# Patient Record
Sex: Female | Born: 1975 | Race: Black or African American | Hispanic: No | Marital: Married | Smoking: Never smoker
Health system: Southern US, Community
[De-identification: ages and names within clinical notes are randomized; demographics above are authoritative.]

## PROBLEM LIST (undated history)

## (undated) DIAGNOSIS — I10 Essential (primary) hypertension: Secondary | ICD-10-CM

## (undated) DIAGNOSIS — F32A Depression, unspecified: Secondary | ICD-10-CM

## (undated) DIAGNOSIS — T7840XA Allergy, unspecified, initial encounter: Secondary | ICD-10-CM

## (undated) DIAGNOSIS — M797 Fibromyalgia: Secondary | ICD-10-CM

## (undated) DIAGNOSIS — D649 Anemia, unspecified: Secondary | ICD-10-CM

## (undated) HISTORY — DX: Anemia, unspecified: D64.9

## (undated) HISTORY — DX: Depression, unspecified: F32.A

## (undated) HISTORY — DX: Allergy, unspecified, initial encounter: T78.40XA

## (undated) HISTORY — DX: Fibromyalgia: M79.7

## (undated) HISTORY — PX: OTHER SURGICAL HISTORY: SHX169

---

## 2014-05-05 DIAGNOSIS — O3440 Maternal care for other abnormalities of cervix, unspecified trimester: Secondary | ICD-10-CM | POA: Insufficient documentation

## 2014-05-05 DIAGNOSIS — F32A Depression, unspecified: Secondary | ICD-10-CM | POA: Insufficient documentation

## 2014-05-05 DIAGNOSIS — O09529 Supervision of elderly multigravida, unspecified trimester: Secondary | ICD-10-CM | POA: Insufficient documentation

## 2014-05-05 DIAGNOSIS — O10919 Unspecified pre-existing hypertension complicating pregnancy, unspecified trimester: Secondary | ICD-10-CM | POA: Insufficient documentation

## 2014-05-05 DIAGNOSIS — F329 Major depressive disorder, single episode, unspecified: Secondary | ICD-10-CM | POA: Insufficient documentation

## 2014-05-05 DIAGNOSIS — Z9889 Other specified postprocedural states: Secondary | ICD-10-CM | POA: Insufficient documentation

## 2014-05-05 DIAGNOSIS — F411 Generalized anxiety disorder: Secondary | ICD-10-CM | POA: Insufficient documentation

## 2014-12-24 DIAGNOSIS — IMO0001 Reserved for inherently not codable concepts without codable children: Secondary | ICD-10-CM | POA: Insufficient documentation

## 2014-12-24 DIAGNOSIS — I10 Essential (primary) hypertension: Secondary | ICD-10-CM | POA: Insufficient documentation

## 2014-12-24 DIAGNOSIS — Z975 Presence of (intrauterine) contraceptive device: Secondary | ICD-10-CM | POA: Insufficient documentation

## 2019-02-19 DIAGNOSIS — E559 Vitamin D deficiency, unspecified: Secondary | ICD-10-CM | POA: Insufficient documentation

## 2019-06-10 ENCOUNTER — Ambulatory Visit (INDEPENDENT_AMBULATORY_CARE_PROVIDER_SITE_OTHER): Payer: Self-pay | Admitting: Family Medicine

## 2019-06-10 ENCOUNTER — Encounter: Payer: Self-pay | Admitting: Family Medicine

## 2019-06-10 ENCOUNTER — Telehealth: Payer: Self-pay | Admitting: Family Medicine

## 2019-06-10 DIAGNOSIS — M545 Low back pain, unspecified: Secondary | ICD-10-CM

## 2019-06-10 DIAGNOSIS — M542 Cervicalgia: Secondary | ICD-10-CM

## 2019-06-10 DIAGNOSIS — G8929 Other chronic pain: Secondary | ICD-10-CM

## 2019-06-10 MED ORDER — PREDNISONE 10 MG PO TABS: ORAL_TABLET | ORAL | 0 refills | Status: DC

## 2019-06-10 MED ORDER — MELOXICAM 15 MG PO TABS: 7.5000 mg | ORAL_TABLET | Freq: Every day | ORAL | 6 refills | Status: DC | PRN

## 2019-06-10 MED ORDER — TIZANIDINE HCL 2 MG PO TABS: 2.0000 mg | ORAL_TABLET | Freq: Every evening | ORAL | 1 refills | Status: DC | PRN

## 2019-06-10 NOTE — Telephone Encounter (Signed)
Patient left a message on the voicemail asking for Dr. Junius Roads to call her. She did not say why on the message. Her call back number is [phone]. Thanks
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
## 2019-06-10 NOTE — Telephone Encounter (Signed)
Left message on the patient's voice mail to call back (or I will try calling her again in the morning).

## 2019-06-11 NOTE — Telephone Encounter (Signed)
Record updated
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
## 2019-06-11 NOTE — Telephone Encounter (Signed)
I called and advised the patient. 

## 2019-06-27 ENCOUNTER — Telehealth: Payer: Self-pay | Admitting: Radiology

## 2019-06-27 NOTE — Telephone Encounter (Signed)
I called patient and advised. 

## 2019-06-27 NOTE — Telephone Encounter (Signed)
Patient left voicemail stating she is having adverse reaction to prednisone and requests a call back.  CB  [phone]

## 2019-06-27 NOTE — Telephone Encounter (Signed)
I called patient. She states that she started the prednisone and has had 6,6,5,5, and 4 doses.  With the first dose, she had anxiety in the middle of the night. Last night she was reading something and had purple dots that came up in her vision and she had blurry vision. She also went to the bathroom and had blood in her stool.  Patient would like to know what you would like for her to do?

## 2019-06-27 NOTE — Telephone Encounter (Signed)
Stop the prednisone, wait a day or two for side effects to subside, then can take meloxicam instead.

## 2019-07-25 DIAGNOSIS — T7491XA Unspecified adult maltreatment, confirmed, initial encounter: Secondary | ICD-10-CM | POA: Insufficient documentation

## 2019-07-25 DIAGNOSIS — M545 Low back pain, unspecified: Secondary | ICD-10-CM | POA: Insufficient documentation

## 2019-08-14 ENCOUNTER — Other Ambulatory Visit: Payer: Self-pay

## 2019-08-14 ENCOUNTER — Emergency Department (HOSPITAL_COMMUNITY)
Admission: EM | Admit: 2019-08-14 | Discharge: 2019-08-14 | Discharge status: Against Medical Advice | Payer: Medicaid Other | Attending: Emergency Medicine | Admitting: Emergency Medicine

## 2019-08-14 ENCOUNTER — Encounter (HOSPITAL_COMMUNITY): Payer: Self-pay | Admitting: Emergency Medicine

## 2019-08-14 DIAGNOSIS — Z5321 Procedure and treatment not carried out due to patient leaving prior to being seen by health care provider: Secondary | ICD-10-CM | POA: Insufficient documentation

## 2019-08-14 HISTORY — DX: Essential (primary) hypertension: I10

## 2019-08-14 NOTE — ED Triage Notes (Signed)
Pt reports noting increasing SOB since Sunday. States she thinks its related to the lisinopril she's taking. No perioral or tongue swelling noted. Speaking in full sentences, NAD, VSS.

## 2019-08-14 NOTE — ED Notes (Signed)
Called for vitals no answer  And stickers found at sort desk

## 2020-01-12 ENCOUNTER — Encounter: Payer: Self-pay | Admitting: Family Medicine

## 2020-01-12 ENCOUNTER — Other Ambulatory Visit: Payer: Self-pay

## 2020-01-12 ENCOUNTER — Ambulatory Visit (INDEPENDENT_AMBULATORY_CARE_PROVIDER_SITE_OTHER): Payer: Medicaid Other | Admitting: Family Medicine

## 2020-01-12 DIAGNOSIS — M5441 Lumbago with sciatica, right side: Secondary | ICD-10-CM

## 2020-01-12 DIAGNOSIS — G8929 Other chronic pain: Secondary | ICD-10-CM
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
## 2020-01-21 ENCOUNTER — Other Ambulatory Visit: Payer: Self-pay

## 2020-01-21 ENCOUNTER — Ambulatory Visit (HOSPITAL_COMMUNITY)
Admission: EM | Admit: 2020-01-21 | Discharge: 2020-01-21 | Discharge status: Absent without leave | Payer: Medicaid Other

## 2020-01-30 ENCOUNTER — Telehealth: Payer: Self-pay | Admitting: Family Medicine

## 2020-01-30 NOTE — Telephone Encounter (Signed)
Patient called stating that she has not heard anything in reference to an appointment with Neurology.  I see a note in the referral that the provider that does the particular test will be out until August and they request that we send the patient somewhere else.  Thank you.

## 2020-01-30 NOTE — Telephone Encounter (Signed)
Have you found someone else that can do this since Dr. Posey Pronto is out?

## 2020-02-03 NOTE — Telephone Encounter (Signed)
Order sent to [facility]

## 2020-03-11 ENCOUNTER — Other Ambulatory Visit: Payer: Self-pay

## 2020-03-11 DIAGNOSIS — M545 Low back pain, unspecified: Secondary | ICD-10-CM

## 2020-03-15 ENCOUNTER — Ambulatory Visit (INDEPENDENT_AMBULATORY_CARE_PROVIDER_SITE_OTHER): Payer: Self-pay | Admitting: Neurology

## 2020-03-15 ENCOUNTER — Encounter: Payer: Self-pay | Admitting: Neurology

## 2020-03-15 ENCOUNTER — Ambulatory Visit: Payer: Self-pay | Admitting: Neurology

## 2020-03-15 DIAGNOSIS — G8929 Other chronic pain: Secondary | ICD-10-CM

## 2020-03-15 DIAGNOSIS — M545 Low back pain, unspecified: Secondary | ICD-10-CM

## 2020-03-15 DIAGNOSIS — M5441 Lumbago with sciatica, right side: Secondary | ICD-10-CM

## 2020-03-15 NOTE — Progress Notes (Signed)
[place]    Nerve / Sites Muscle Latency Ref. Amplitude Ref. Rel Amp Segments Distance Velocity Ref. Area    ms ms mV mV %  cm m/s m/s mVms  R Peroneal - EDB     Ankle EDB 4.0 ?6.5 8.4 ?2.0 100 Ankle - EDB 9   19.4     Fib head EDB 10.7  6.9  82.1 Fib head - Ankle 32 48 ?44 17.7     Pop fossa EDB 12.8  6.5  94.6 Pop fossa - Fib head 10 48 ?44 17.3         Pop fossa - Ankle      R Tibial - AH     Ankle AH 3.5 ?5.8 8.5 ?4.0 100 Ankle - AH 9   17.5     Pop fossa AH 12.6  7.0  81.6 Pop fossa - Ankle 41 45 ?41 18.5         SNC    Nerve / Sites Rec. Site Peak Lat Ref.  Amp Ref. Segments Distance    ms ms V V  cm  R Sural - Ankle (Calf)     Calf Ankle 3.6 ?4.4 17 ?6 Calf - Ankle 14  R Superficial peroneal - Ankle     Lat leg Ankle 4.1 ?4.4 7 ?6 Lat leg - Ankle 14         F  Wave    Nerve F Lat Ref.   ms ms  R Tibial - AH 52.4 ?56.0
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
## 2020-03-15 NOTE — Progress Notes (Signed)
Please refer to EMG and nerve conduction procedure note.  

## 2020-04-02 ENCOUNTER — Ambulatory Visit (INDEPENDENT_AMBULATORY_CARE_PROVIDER_SITE_OTHER): Payer: Medicaid Other | Admitting: Obstetrics and Gynecology

## 2020-04-02 ENCOUNTER — Other Ambulatory Visit: Payer: Self-pay

## 2020-04-02 ENCOUNTER — Encounter: Payer: Self-pay | Admitting: Obstetrics and Gynecology

## 2020-04-02 ENCOUNTER — Other Ambulatory Visit (HOSPITAL_COMMUNITY)
Admission: RE | Admit: 2020-04-02 | Discharge: 2020-04-02 | Disposition: A | Discharge status: Home / Self Care | Payer: 59 | Source: Ambulatory Visit | Attending: Obstetrics and Gynecology | Admitting: Obstetrics and Gynecology

## 2020-04-02 VITALS — BP 163/118 | HR 76 | Ht 67.5 in | Wt 191.4 lb

## 2020-04-02 DIAGNOSIS — Z91410 Personal history of adult physical and sexual abuse: Secondary | ICD-10-CM

## 2020-04-02 DIAGNOSIS — Z30432 Encounter for removal of intrauterine contraceptive device: Secondary | ICD-10-CM

## 2020-04-02 DIAGNOSIS — Z124 Encounter for screening for malignant neoplasm of cervix: Secondary | ICD-10-CM | POA: Diagnosis present

## 2020-04-02 DIAGNOSIS — Z01419 Encounter for gynecological examination (general) (routine) without abnormal findings: Secondary | ICD-10-CM | POA: Diagnosis not present

## 2020-04-02 DIAGNOSIS — N898 Other specified noninflammatory disorders of vagina: Secondary | ICD-10-CM | POA: Diagnosis not present

## 2020-04-02 DIAGNOSIS — N393 Stress incontinence (female) (male): Secondary | ICD-10-CM

## 2020-04-02 DIAGNOSIS — T7491XD Unspecified adult maltreatment, confirmed, subsequent encounter: Secondary | ICD-10-CM

## 2020-04-02 DIAGNOSIS — Z113 Encounter for screening for infections with a predominantly sexual mode of transmission: Secondary | ICD-10-CM

## 2020-04-02 DIAGNOSIS — Z1231 Encounter for screening mammogram for malignant neoplasm of breast: Secondary | ICD-10-CM
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
## 2020-04-02 NOTE — Progress Notes (Signed)
NGYN presents for IUD removal and c/o vaginal odor and urinary incontinence  Mirena inserted 12/15/14  Elevated BP - did not take BP meds

## 2020-04-02 NOTE — Patient Instructions (Signed)
Vulvar Hygiene You should wear cotton brief underwear and avoid tight-fitting undergarments including avoiding underwear made of synthetic fabrics. You should keep area as dry as possible. Use non-irritating soaps and laundry detergents. Use as few products in vulvar area as possible as even products designed to improve irritation can cause more irritation. Do not use douches, lotions or vaginal sprays as these are very irritating.  

## 2020-04-05 ENCOUNTER — Other Ambulatory Visit: Payer: Self-pay

## 2020-04-05 ENCOUNTER — Other Ambulatory Visit: Payer: Medicaid Other

## 2020-04-06 ENCOUNTER — Telehealth: Payer: Self-pay | Admitting: Family Medicine

## 2020-04-06 LAB — HEPATITIS B SURFACE ANTIGEN: Hepatitis B Surface Ag: NEGATIVE

## 2020-04-06 LAB — CERVICOVAGINAL ANCILLARY ONLY
Chlamydia: NEGATIVE
Comment: NEGATIVE
Comment: NORMAL
Neisseria Gonorrhea: NEGATIVE

## 2020-04-06 LAB — HEPATITIS C ANTIBODY: Hep C Virus Ab: <0.1 s/co ratio (ref 0.0–0.9)

## 2020-04-06 LAB — HIV ANTIBODY (ROUTINE TESTING W REFLEX): HIV Screen 4th Generation wRfx: NONREACTIVE

## 2020-04-06 LAB — RPR: RPR Ser Ql: NONREACTIVE

## 2020-04-06 NOTE — Telephone Encounter (Signed)
It was Dr. Jannifer Franklin at [facility] that did the test - notes in the chart.  Please advise.

## 2020-04-06 NOTE — Telephone Encounter (Signed)
Patient called.   She was sent to Dr.Newton for a NCS and he found nothing. She wants to know what her next step is now.   Call back: [phone]

## 2020-04-06 NOTE — Telephone Encounter (Signed)
Can we get her MRI images sent to [place] on CD?  I'd like to see them myself to be sure nothing was missed.

## 2020-04-07 LAB — URINE CULTURE

## 2020-04-07 LAB — CYTOLOGY - PAP
Comment: NEGATIVE
Diagnosis: NEGATIVE
High risk HPV: NEGATIVE

## 2020-04-07 NOTE — Telephone Encounter (Signed)
Left VM to call me back regarding MRI CD.

## 2020-04-08 ENCOUNTER — Other Ambulatory Visit: Payer: Self-pay | Admitting: Obstetrics and Gynecology

## 2020-04-08 DIAGNOSIS — Z1231 Encounter for screening mammogram for malignant neoplasm of breast: Secondary | ICD-10-CM

## 2020-04-09 ENCOUNTER — Ambulatory Visit: Payer: 59 | Admitting: Nurse Practitioner

## 2020-04-09 ENCOUNTER — Other Ambulatory Visit: Payer: Self-pay | Admitting: Nurse Practitioner

## 2020-04-09 ENCOUNTER — Other Ambulatory Visit: Payer: Self-pay

## 2020-04-09 ENCOUNTER — Encounter: Payer: Self-pay | Admitting: Nurse Practitioner

## 2020-04-09 VITALS — Ht 67.0 in | Wt 194.0 lb

## 2020-04-09 DIAGNOSIS — Z7689 Persons encountering health services in other specified circumstances: Secondary | ICD-10-CM

## 2020-04-09 DIAGNOSIS — D649 Anemia, unspecified: Secondary | ICD-10-CM | POA: Diagnosis not present

## 2020-04-09 DIAGNOSIS — E669 Obesity, unspecified: Secondary | ICD-10-CM | POA: Diagnosis not present

## 2020-04-09 DIAGNOSIS — I10 Essential (primary) hypertension: Secondary | ICD-10-CM

## 2020-04-09 DIAGNOSIS — E559 Vitamin D deficiency, unspecified: Secondary | ICD-10-CM

## 2020-04-09 MED ORDER — LOSARTAN POTASSIUM 50 MG PO TABS: 50.0000 mg | ORAL_TABLET | Freq: Every day | ORAL | 0 refills | Status: DC

## 2020-04-09 MED ORDER — FERROUS SULFATE 325 (65 FE) MG PO TABS: 325.0000 mg | ORAL_TABLET | Freq: Every day | ORAL | 1 refills | Status: DC

## 2020-04-09 NOTE — Addendum Note (Signed)
Addended bySteffanie Dunn on: 04/09/2020 09:25 AM   Modules accepted: Orders

## 2020-04-09 NOTE — Progress Notes (Signed)
Virtual Visit via Telephone Note Due to national recommendations of social distancing due to [place] 19, telehealth visit is felt to be most appropriate for this patient at this time.  I discussed the limitations, risks, security and privacy concerns of performing an evaluation and management service by telephone and the availability of in person appointments. I also discussed with the patient that there may be a patient responsible charge related to this service. The patient expressed understanding and agreed to proceed.    I connected with Catherine Garza on 04/09/20  at   8:50 AM EDT  EDT by telephone and verified that I am speaking with the correct person using two identifiers.   Consent I discussed the limitations, risks, security and privacy concerns of performing an evaluation and management service by telephone and the availability of in person appointments. I also discussed with the patient that there may be a patient responsible charge related to this service. The patient expressed understanding and agreed to proceed.   Location of Patient: Private  Residence   Location of Provider: [place] and [place] participating in Telemedicine visit: Catherine Rankins FNP-BC [place]    History of Present Illness: Telemedicine visit for: Establish Care Patient has been counseled on age-appropriate routine health concerns for screening and prevention. These are reviewed and up-to-date. Referrals have been placed accordingly. Immunizations are up-to-date or declined.    PAP SMEAR 03-2020 MAMMOGRAM scheduled for 7-22   Essential Hypertension Not well controlled. She does not frequently monitor her blood pressure at home. Currently taking amlodipine 10 mg daily. She has not been taking her amlodipine as she states it  She has also taken lisinopril in the past but can not recall the reason she stopped taking it. Will start her on Losartan today. Denies  chest pain, shortness of breath, palpitations, lightheadedness, dizziness, headaches or BLE edema.  BP Readings from Last 3 Encounters:  04/02/20 (!) 163/118  08/14/19 (!) 158/99    Left arm is painful when she stretches it out. Aggravating factors: heavy lifting. She has not taken any OTC analgesics nor does she perform any ROM exercises. I recommended she perform stretching and ROM exercises as well as heat application. She denies any trauma or injury.     Past Medical History:  Diagnosis Date  . Depression   . Hypertension     Past Surgical History:  Procedure Laterality Date  . leep      Family History  Problem Relation Age of Onset  . Hypertension Mother   . Hypertension Father   . Ovarian cancer Maternal Grandmother   . Throat cancer Paternal Grandfather        smoker     Social History   Socioeconomic History  . Marital status: Single    Spouse name: Not on file  . Number of children: Not on file  . Years of education: Not on file  . Highest education level: Not on file  Occupational History  . Not on file  Tobacco Use  . Smoking status: Never Smoker  . Smokeless tobacco: Never Used  Substance and Sexual Activity  . Alcohol use: Never  . Drug use: Never  . Sexual activity: Not Currently    Birth control/protection: None  Other Topics Concern  . Not on file  Social History Narrative  . Not on file   Social Determinants of Health   Financial Resource Strain:   . Difficulty of Paying Living Expenses:  Food Insecurity:   . Worried About Programme researcher, broadcasting/film/video in the Last Year:   . Barista in the Last Year:   Transportation Needs:   . Freight forwarder (Medical):   Marland Kitchen Lack of Transportation (Non-Medical):   Physical Activity:   . Days of Exercise per Week:   . Minutes of Exercise per Session:   Stress:   . Feeling of Stress :   Social Connections:   . Frequency of Communication with Friends and Family:   . Frequency of Social Gatherings  with Friends and Family:   . Attends Religious Services:   . Active Member of Clubs or Organizations:   . Attends Banker Meetings:   Marland Kitchen Marital Status:      Observations/Objective: Awake, alert and oriented x 3   Review of Systems  Constitutional: Negative for fever, malaise/fatigue and weight loss.  HENT: Negative.  Negative for nosebleeds.   Eyes: Negative.  Negative for blurred vision, double vision and photophobia.  Respiratory: Negative.  Negative for cough and shortness of breath.   Cardiovascular: Negative.  Negative for chest pain, palpitations and leg swelling.  Gastrointestinal: Negative.  Negative for heartburn, nausea and vomiting.  Musculoskeletal: Negative.  Negative for myalgias.  Neurological: Negative.  Negative for dizziness, focal weakness, seizures and headaches.  Psychiatric/Behavioral: Negative.  Negative for suicidal ideas.    Assessment and Plan: [name] was seen today for establish care.  Diagnoses and all orders for this visit:  Encounter to establish care  Essential hypertension -     CMP14+EGFR; Future -     TSH; Future -     losartan (COZAAR) 50 MG tablet; Take 1 tablet (50 mg total) by mouth daily. Continue all antihypertensives as prescribed.  Remember to bring in your blood pressure log with you for your follow up appointment.  DASH/Mediterranean Diets are healthier choices for HTN.    Obesity (BMI 30-39.9) -     CMP14+EGFR; Future -     Hemoglobin A1c; Future -     Lipid panel; Future -     TSH; Future Discussed diet and exercise for person with BMI >30. Instructed: You must burn more calories than you eat. Losing 5 percent of your body weight should be considered a success. In the longer term, losing more than 15 percent of your body weight and staying at this weight is an extremely good result. However, keep in mind that even losing 5 percent of your body weight leads to important health benefits, so try not to get discouraged  if you're not able to lose more than this. Will recheck weight in 3-6 months.  Anemia, unspecified type -     CBC; Future -     ferrous sulfate 325 (65 FE) MG tablet; Take 1 tablet (325 mg total) by mouth daily with breakfast.  Vitamin D deficiency disease -     VITAMIN D 25 Hydroxy (Vit-D Deficiency, Fractures); Future     Follow Up Instructions Return in about 4 weeks (around 05/07/2020) for BP recheck.     I discussed the assessment and treatment plan with the patient. The patient was provided an opportunity to ask questions and all were answered. The patient agreed with the plan and demonstrated an understanding of the instructions.   The patient was advised to call back or seek an in-person evaluation if the symptoms worsen or if the condition fails to improve as anticipated.  I provided 18 minutes of non-face-to-face time during  this encounter including median intraservice time, reviewing previous notes, labs, imaging, medications and explaining diagnosis and management.  Gildardo Pounds, FNP-BC

## 2020-04-10 LAB — CBC
Hematocrit: 42.5 % (ref 34.0–46.6)
Hemoglobin: 14.3 g/dL (ref 11.1–15.9)
MCH: 28.8 pg (ref 26.6–33.0)
MCHC: 33.6 g/dL (ref 31.5–35.7)
MCV: 86 fL (ref 79–97)
Platelets: 293 10*3/uL (ref 150–450)
RBC: 4.96 x10E6/uL (ref 3.77–5.28)
RDW: 13 % (ref 11.7–15.4)
WBC: 8.9 10*3/uL (ref 3.4–10.8)

## 2020-04-10 LAB — CMP14+EGFR
ALT: 37 [IU]/L — ABNORMAL HIGH (ref 0–32)
AST: 20 [IU]/L (ref 0–40)
Albumin/Globulin Ratio: 1.8 (ref 1.2–2.2)
Albumin: 4.6 g/dL (ref 3.8–4.8)
Alkaline Phosphatase: 111 [IU]/L (ref 48–121)
BUN/Creatinine Ratio: 12 (ref 9–23)
BUN: 9 mg/dL (ref 6–24)
Bilirubin Total: 0.3 mg/dL (ref 0.0–1.2)
CO2: 22 mmol/L (ref 20–29)
Calcium: 9 mg/dL (ref 8.7–10.2)
Chloride: 106 mmol/L (ref 96–106)
Creatinine, Ser: 0.75 mg/dL (ref 0.57–1.00)
GFR calc Af Amer: 114 mL/min/{1.73_m2}
GFR calc non Af Amer: 99 mL/min/{1.73_m2}
Globulin, Total: 2.5 g/dL (ref 1.5–4.5)
Glucose: 94 mg/dL (ref 65–99)
Potassium: 4 mmol/L (ref 3.5–5.2)
Sodium: 139 mmol/L (ref 134–144)
Total Protein: 7.1 g/dL (ref 6.0–8.5)

## 2020-04-10 LAB — LIPID PANEL
Chol/HDL Ratio: 3.9 ratio (ref 0.0–4.4)
Cholesterol, Total: 216 mg/dL — ABNORMAL HIGH (ref 100–199)
HDL: 55 mg/dL (ref >39)
LDL Chol Calc (NIH): 153 mg/dL — ABNORMAL HIGH (ref 0–99)
Triglycerides: 49 mg/dL (ref 0–149)
VLDL Cholesterol Cal: 8 mg/dL (ref 5–40)

## 2020-04-10 LAB — TSH: TSH: 1.99 u[IU]/mL (ref 0.450–4.500)

## 2020-04-10 LAB — HEMOGLOBIN A1C
Est. average glucose Bld gHb Est-mCnc: 103 mg/dL
Hgb A1c MFr Bld: 5.2 % (ref 4.8–5.6)

## 2020-04-10 LAB — VITAMIN D 25 HYDROXY (VIT D DEFICIENCY, FRACTURES): Vit D, 25-Hydroxy: 30.5 ng/mL (ref 30.0–100.0)

## 2020-04-12 ENCOUNTER — Other Ambulatory Visit: Payer: Medicaid Other

## 2020-04-12 NOTE — Telephone Encounter (Signed)
The patient will drop off the cd tomorrow morning. She just wants to make sure she gets the cd back. I advised her she will be contacted once he has looked at the cd and has decided on the next step, and the cd will be returned to her.

## 2020-04-13 ENCOUNTER — Encounter: Payer: Self-pay | Admitting: *Deleted

## 2020-04-14 ENCOUNTER — Telehealth: Payer: Self-pay | Admitting: Family Medicine

## 2020-04-14 NOTE — Telephone Encounter (Signed)
Dr. Junius Roads now has the cd and will review it. Will call the patient with any instructions/plan from Dr. Junius Roads (and let her know when she can pick up the cd).

## 2020-04-14 NOTE — Progress Notes (Signed)
Attempt to reach patient for her e-mail address. No answer and LVM.

## 2020-04-14 NOTE — Telephone Encounter (Signed)
I reviewed the MRI of cervical and lumbar spine on CD and agree that there are a couple bulging discs, but no nerve impingement.  Treatment options would include:  Trying physical therapy.  Referral for epidural steroid injection.  Repeating MRI scan to see if anything has changed.

## 2020-04-14 NOTE — Telephone Encounter (Signed)
Do you know where that CD is now?

## 2020-04-14 NOTE — Telephone Encounter (Signed)
Wanted to let Dr.Hilts know she dropped off the MRI CD.

## 2020-04-15 ENCOUNTER — Encounter: Payer: Self-pay | Admitting: Family Medicine

## 2020-04-15 ENCOUNTER — Ambulatory Visit: Payer: 59

## 2020-04-15 ENCOUNTER — Other Ambulatory Visit: Payer: Self-pay

## 2020-04-15 ENCOUNTER — Ambulatory Visit
Admission: RE | Admit: 2020-04-15 | Discharge: 2020-04-15 | Disposition: A | Discharge status: Home / Self Care | Payer: 59 | Source: Ambulatory Visit | Attending: Obstetrics and Gynecology | Admitting: Obstetrics and Gynecology

## 2020-04-15 DIAGNOSIS — Z1231 Encounter for screening mammogram for malignant neoplasm of breast: Secondary | ICD-10-CM

## 2020-04-16 ENCOUNTER — Other Ambulatory Visit: Payer: Self-pay

## 2020-04-16 DIAGNOSIS — G8929 Other chronic pain: Secondary | ICD-10-CM

## 2020-04-19 ENCOUNTER — Other Ambulatory Visit: Payer: Self-pay | Admitting: Obstetrics and Gynecology

## 2020-04-19 DIAGNOSIS — R928 Other abnormal and inconclusive findings on diagnostic imaging of breast: Secondary | ICD-10-CM

## 2020-04-22 ENCOUNTER — Telehealth: Payer: Self-pay | Admitting: Physical Medicine and Rehabilitation

## 2020-04-22 NOTE — Telephone Encounter (Signed)
Patient called. Says she called her insurance company. They need more information in order for injection to be approved for Dr. Ernestina Patches. [employer]'s fax number is [phone]. What needs to be on the cover sheet is  case #[id number] attention reconsideration DOB [birth year] Catherine Garza. Patient also request a call back from Dr. Junius Roads. CB [phone]

## 2020-04-22 NOTE — Telephone Encounter (Signed)
Not sure what else they need for this auth.

## 2020-04-26 NOTE — Telephone Encounter (Signed)
I sent the patient a message through MyChart - Dr. Romona Curls assistant is still working on this for her and she will be in contact with the patient.

## 2020-04-30 ENCOUNTER — Telehealth: Payer: Self-pay | Admitting: Family Medicine

## 2020-04-30 NOTE — Telephone Encounter (Signed)
Pt called stating she's going to need Dr.Hilts to call her insurance company and explain why he is referring her to [facility];  and pt would like a CB to further discuss the pain radiating and the giving out of her legs.   Insurance # [id number] * Option 4  [phone]

## 2020-05-03 NOTE — Telephone Encounter (Signed)
FYI:  I called the patient. She says the insurance company has sent her a letter - her understanding of it is that [employer] does not have enough information and so they have not authorized the [facility]. I advised the patient that all pertinent information has been sent to the insurance company and their response is still pending. The patient said she has asked the facility where she has been to PT to also send records or a note about her PT visits (proving conservative care has been tried). The patient is unsure if this has happened and plans to follow up on this, herself. I advised her that the note should include how many PT visits she has been to and for how long. I am not sure what else is needed for the insurance company to authorize the [facility].

## 2020-05-04 ENCOUNTER — Telehealth: Payer: Self-pay | Admitting: Family Medicine

## 2020-05-04 NOTE — Telephone Encounter (Signed)
I have seen Catherine Garza on June 10, 2019 and also January 12, 2020.  At both visits she complained of chronic back pain with radiation into the right leg.  This is well-documented in both office notes.  Since she has otherwise failed conservative management, I believe it is medically indicated for her to undergo epidural steroid injection in the lumbar spine.  A letter from her insurance company dated April 30, 2020 states that this request has been denied due to lack of documentation of her radicular pain.  Clearly this is incorrect as her radicular symptoms have been documented twice in my notes.  Please reconsider your denial of our request for epidural steroid injection.

## 2020-05-05 ENCOUNTER — Ambulatory Visit
Admission: RE | Admit: 2020-05-05 | Discharge: 2020-05-05 | Disposition: A | Discharge status: Home / Self Care | Payer: 59 | Source: Ambulatory Visit | Attending: Obstetrics and Gynecology | Admitting: Obstetrics and Gynecology

## 2020-05-05 ENCOUNTER — Ambulatory Visit: Payer: 59

## 2020-05-05 ENCOUNTER — Other Ambulatory Visit: Payer: Self-pay

## 2020-05-05 DIAGNOSIS — R928 Other abnormal and inconclusive findings on diagnostic imaging of breast: Secondary | ICD-10-CM

## 2020-05-12 ENCOUNTER — Other Ambulatory Visit: Payer: Self-pay

## 2020-05-12 ENCOUNTER — Ambulatory Visit: Payer: 59 | Attending: Nurse Practitioner | Admitting: Nurse Practitioner

## 2020-05-12 ENCOUNTER — Encounter: Payer: Self-pay | Admitting: Nurse Practitioner

## 2020-05-12 VITALS — BP 137/90 | HR 79 | Temp 97.7°F | Ht 67.5 in | Wt 198.0 lb

## 2020-05-12 DIAGNOSIS — I10 Essential (primary) hypertension: Secondary | ICD-10-CM

## 2020-05-12 DIAGNOSIS — D500 Iron deficiency anemia secondary to blood loss (chronic): Secondary | ICD-10-CM
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
## 2020-05-14 ENCOUNTER — Encounter: Payer: Self-pay | Admitting: Nurse Practitioner

## 2020-05-21 ENCOUNTER — Telehealth: Payer: Self-pay | Admitting: Physical Medicine and Rehabilitation

## 2020-05-21 NOTE — Telephone Encounter (Signed)
Left message

## 2020-05-21 NOTE — Telephone Encounter (Signed)
Patient called requesting a call back. Patient has medical questions for Dr. Ernestina Patches. Dr. Ernestina Patches please call patient at [phone].

## 2020-05-28 ENCOUNTER — Telehealth: Payer: Self-pay | Admitting: Physical Medicine and Rehabilitation

## 2020-05-28 NOTE — Telephone Encounter (Signed)
I have sch her for 06/03/20.

## 2020-05-28 NOTE — Telephone Encounter (Signed)
Patient returning call to [facility]. Please call patient at [phone].

## 2020-06-03 ENCOUNTER — Ambulatory Visit (INDEPENDENT_AMBULATORY_CARE_PROVIDER_SITE_OTHER): Payer: 59 | Admitting: Physical Medicine and Rehabilitation

## 2020-06-03 ENCOUNTER — Other Ambulatory Visit: Payer: Self-pay

## 2020-06-03 DIAGNOSIS — M5441 Lumbago with sciatica, right side: Secondary | ICD-10-CM | POA: Diagnosis not present

## 2020-06-03 DIAGNOSIS — G8929 Other chronic pain: Secondary | ICD-10-CM

## 2020-06-03 DIAGNOSIS — M5116 Intervertebral disc disorders with radiculopathy, lumbar region: Secondary | ICD-10-CM | POA: Diagnosis not present

## 2020-06-03 DIAGNOSIS — M47816 Spondylosis without myelopathy or radiculopathy, lumbar region: Secondary | ICD-10-CM

## 2020-06-03 NOTE — Progress Notes (Signed)
   Numeric Pain Rating Scale and Functional Assessment Average Pain 10 Pain Right Now 5 My pain is sharp, stabbing and tingling Pain is worse with: walking, sitting, standing and some activites Pain improves with: rest, heat/ice, therapy/exercise and medication   In the last MONTH (on 0-10 scale) has pain interfered with the following?  1. General activity like being  able to carry out your everyday physical activities such as walking, climbing stairs, carrying groceries, or moving a chair?  Rating(8)  2. Relation with others like being able to carry out your usual social activities and roles such as  activities at home, at work and in your community. Rating(8)  3. Enjoyment of life such that you have  been bothered by emotional problems such as feeling anxious, depressed or irritable?  Rating(8)

## 2020-06-09 ENCOUNTER — Encounter: Payer: Self-pay | Admitting: Physical Medicine and Rehabilitation
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
## 2020-06-14 ENCOUNTER — Telehealth: Payer: Self-pay | Admitting: Physical Medicine and Rehabilitation

## 2020-06-14 ENCOUNTER — Encounter: Payer: Self-pay | Admitting: Physical Medicine and Rehabilitation

## 2020-06-14 NOTE — Telephone Encounter (Signed)
Called pt and sch. 

## 2020-06-14 NOTE — Telephone Encounter (Signed)
ESI has been approved by insurance. Left message #1 to schedule patient for right L5-S1 IL. We can schedule this o Thursday afternoon as a planned 15 minute injection.

## 2020-07-05 ENCOUNTER — Ambulatory Visit: Payer: Self-pay

## 2020-07-05 ENCOUNTER — Ambulatory Visit (INDEPENDENT_AMBULATORY_CARE_PROVIDER_SITE_OTHER): Payer: 59 | Admitting: Physical Medicine and Rehabilitation

## 2020-07-05 ENCOUNTER — Encounter: Payer: Self-pay | Admitting: Physical Medicine and Rehabilitation

## 2020-07-05 ENCOUNTER — Other Ambulatory Visit: Payer: Self-pay

## 2020-07-05 VITALS — BP 131/93 | HR 86

## 2020-07-05 DIAGNOSIS — M5116 Intervertebral disc disorders with radiculopathy, lumbar region: Secondary | ICD-10-CM | POA: Diagnosis not present

## 2020-07-05 MED ORDER — METHYLPREDNISOLONE ACETATE 80 MG/ML IJ SUSP
80.0000 mg | Freq: Once | INTRAMUSCULAR | Status: AC
  Administered 2020-07-05: 80 mg

## 2020-07-05 NOTE — Procedures (Signed)
Lumbar Epidural Steroid Injection - Interlaminar Approach with Fluoroscopic Guidance  Patient: Catherine Garza      Date of Birth: April 17, 1976 MRN: [id number] PCP: Gildardo Pounds, NP      Visit Date: 07/05/2020   Universal Protocol:     Consent Given By: the patient  Position: PRONE  Additional Comments: Vital signs were monitored before and after the procedure. Patient was prepped and draped in the usual sterile fashion. The correct patient, procedure, and site was verified.   Injection Procedure Details:   Procedure diagnoses:  1. Radiculopathy due to lumbar intervertebral disc disorder      Meds Administered:  Meds ordered this encounter  Medications  . methylPREDNISolone acetate (DEPO-MEDROL) injection 80 mg     Laterality: Right  Location/Site:  L5-S1  Needle size: 20 G  Needle type: Tuohy  Needle Placement: Paramedian epidural  Findings:   -Comments: Excellent flow of contrast into the epidural space.  Procedure Details: Using a paramedian approach from the side mentioned above, the region overlying the inferior lamina was localized under fluoroscopic visualization and the soft tissues overlying this structure were infiltrated with 4 ml. of 1% Lidocaine without Epinephrine. The Tuohy needle was inserted into the epidural space using a paramedian approach.   The epidural space was localized using loss of resistance along with counter oblique bi-planar fluoroscopic views.  After negative aspirate for air, blood, and CSF, a 2 ml. volume of Isovue-250 was injected into the epidural space and the flow of contrast was observed. Radiographs were obtained for documentation purposes.    The injectate was administered into the level noted above.   Additional Comments:  The patient tolerated the procedure well Dressing: 2 x 2 sterile gauze and Band-Aid    Post-procedure details: Patient was observed during the procedure. Post-procedure instructions were  reviewed.  Patient left the clinic in stable condition.

## 2020-07-05 NOTE — Progress Notes (Signed)
Pt state lower back pain that at some point her leg feels they give out. Pt state walking and sitting for a long time makes the pain worse. Pt state any kind of lifting makes her pain worse. Pt state message and pain meds that didn't work but it can ease the pain.  Numeric Pain Rating Scale and Functional Assessment Average Pain 6   In the last MONTH (on 0-10 scale) has pain interfered with the following?  1. General activity like being  able to carry out your everyday physical activities such as walking, climbing stairs, carrying groceries, or moving a chair?  Rating(10)   +Driver, -BT, -Dye Allergies.

## 2020-07-05 NOTE — Progress Notes (Signed)
Catherine Garza - 44 y.o. female MRN [id number]  Date of birth: [birth year]  Office Visit Note: Visit Date: 07/05/2020 PCP: Gildardo Pounds, NP Referred by: Gildardo Pounds, NP  Subjective: Chief Complaint  Patient presents with  . Lower Back - Pain   HPI:  Catherine Garza is a 44 y.o. female who comes in today at the request of Dr. Laurence Spates for planned Right L5-S1 Lumbar epidural steroid injection with fluoroscopic guidance.  The patient has failed conservative care including home exercise, medications, time and activity modification.  This injection will be diagnostic and hopefully therapeutic.  Please see requesting physician notes for further details and justification.   ROS Otherwise per HPI.  Assessment & Plan: Visit Diagnoses:  1. Radiculopathy due to lumbar intervertebral disc disorder     Plan: No additional findings.   Meds & Orders:  Meds ordered this encounter  Medications  . methylPREDNISolone acetate (DEPO-MEDROL) injection 80 mg    Orders Placed This Encounter  Procedures  . XR C-ARM NO REPORT  . Epidural Steroid injection    Follow-up: Return for visit to requesting physician as needed.   Procedures: No procedures performed  Lumbar Epidural Steroid Injection - Interlaminar Approach with Fluoroscopic Guidance  Patient: Catherine Garza      Date of Birth: 1976/03/14 MRN: [id number] PCP: Gildardo Pounds, NP      Visit Date: 07/05/2020   Universal Protocol:     Consent Given By: the patient  Position: PRONE  Additional Comments: Vital signs were monitored before and after the procedure. Patient was prepped and draped in the usual sterile fashion. The correct patient, procedure, and site was verified.   Injection Procedure Details:   Procedure diagnoses:  1. Radiculopathy due to lumbar intervertebral disc disorder      Meds Administered:  Meds ordered this encounter  Medications  . methylPREDNISolone acetate (DEPO-MEDROL) injection 80 mg      Laterality: Right  Location/Site:  L5-S1  Needle size: 20 G  Needle type: Tuohy  Needle Placement: Paramedian epidural  Findings:   -Comments: Excellent flow of contrast into the epidural space.  Procedure Details: Using a paramedian approach from the side mentioned above, the region overlying the inferior lamina was localized under fluoroscopic visualization and the soft tissues overlying this structure were infiltrated with 4 ml. of 1% Lidocaine without Epinephrine. The Tuohy needle was inserted into the epidural space using a paramedian approach.   The epidural space was localized using loss of resistance along with counter oblique bi-planar fluoroscopic views.  After negative aspirate for air, blood, and CSF, a 2 ml. volume of Isovue-250 was injected into the epidural space and the flow of contrast was observed. Radiographs were obtained for documentation purposes.    The injectate was administered into the level noted above.   Additional Comments:  The patient tolerated the procedure well Dressing: 2 x 2 sterile gauze and Band-Aid    Post-procedure details: Patient was observed during the procedure. Post-procedure instructions were reviewed.  Patient left the clinic in stable condition.     Clinical History: Electrodiagnostic study of the lower limbs:  IMPRESSION:    Nerve conduction studies done on the right lower extremity were unremarkable, no evidence of neuropathy was seen. EMG evaluation of the right lower extremity was within normal limits, there is no evidence of an overlying lumbar radiculopathy on this evaluation.    Jill Alexanders MD  03/15/2020 3:50 PM --- Patient name: Catherine Garza Referring physician: Osker Mason  Radiologist: Deedra Ehrich, MD Date of service: 04/10/2019 Exam requested: MRI SPINE LUMBAR  SPINE W/O CONTRAST  Findings:  T12-L1: No disc herniation, central canal, or foraminal stenosis. L1-2: No disc herniation,  central canal, or foraminal stenosis. L2-3: No disc herniation, central canal, or foraminal stenosis. L3-4: No disc herniation, central canal, or foraminal stenosis. L4-5: No disc herniation, central canal, or foraminal stenosis. L5-S1: There is a central protrusion-type disc herniation impinging the ventral thecal sac and contacting the traversing S1 nerve roots (axial 23, sagittal 6 ).  No significant disc height loss is present.  On the STIR images there is an annular tear posteriorly (sagittal STIR image 6).  No significant foraminal stenosis.  No osteophyte formation or degenerative changes.  IMPRESSION: 1. L5-S1 protrusion-type disc herniation impinging the ventral thecal sac with associated annular tear and abutting the traversing S1 nerve roots  2.  Within a reasonable degree of certainty, the disc herniation appears t traumatic in etiology and associated with the date of injury 02/03/2019 with clinical correlation is recommended.     Objective:  VS:  HT:    WT:   BMI:     BP:(!) 131/93  HR:86bpm  TEMP: ( )  RESP:  Physical Exam Constitutional:      General: She is not in acute distress.    Appearance: Normal appearance. She is not ill-appearing.  HENT:     Head: Normocephalic and atraumatic.     Right Ear: External ear normal.     Left Ear: External ear normal.  Eyes:     Extraocular Movements: Extraocular movements intact.  Cardiovascular:     Rate and Rhythm: Normal rate.     Pulses: Normal pulses.  Musculoskeletal:     Right lower leg: No edema.     Left lower leg: No edema.     Comments: Patient has good distal strength with no pain over the greater trochanters.  No clonus or focal weakness.  Skin:    Findings: No erythema, lesion or rash.  Neurological:     General: No focal deficit present.     Mental Status: She is alert and oriented to person, place, and time.     Sensory: No sensory deficit.     Motor: No weakness or abnormal muscle tone.     Coordination:  Coordination normal.  Psychiatric:        Mood and Affect: Mood normal.        Behavior: Behavior normal.      Imaging: XR C-ARM NO REPORT  Result Date: 07/05/2020 Please see Notes tab for imaging impression.

## 2020-07-12 ENCOUNTER — Other Ambulatory Visit: Payer: Self-pay | Admitting: Obstetrics and Gynecology

## 2020-07-12 DIAGNOSIS — N8110 Cystocele, unspecified: Secondary | ICD-10-CM

## 2020-07-19 ENCOUNTER — Ambulatory Visit: Payer: 59 | Admitting: Family Medicine

## 2020-10-15 NOTE — Progress Notes (Deleted)
[facility] New Patient Evaluation and Consultation  Referring Provider: Sloan Leiter, MD PCP: Gildardo Pounds, NP Date of Garza: 10/22/2020  SUBJECTIVE Chief Complaint: No chief complaint on file.  History of Present Illness: Catherine Garza is a 45 y.o. Black or African-American female seen in consultation at the request of Dr. Rosana Hoes for evaluation of incontinence.    Review of records significant for: Complains of leakage of urine with bending over.   Urinary Symptoms: {urine leakage?:24754} Leaks *** time(s) per {days/wks/mos/yrs:310907}.  Pad use: {NUMBERS 1-10:18281} {pad option:24752} per day.   She {ACTION; IS/IS GI:087931 bothered by her UI symptoms.  Day time voids ***.  Nocturia: *** times per night to void. Voiding dysfunction: she {empties:24755} her bladder well.  {DOES NOT does:27190::"does not"} use a catheter to empty bladder.  When urinating, she feels {urine symptoms:24756} Drinks: *** per day  UTIs: {NUMBERS 1-10:18281} UTI's in the last year.   {ACTIONS;DENIES/REPORTS:21021675::"Denies"} history of {urologic concerns:24757}  Pelvic Organ Prolapse Symptoms:                  She {denies/ admits to:24761} a feeling of a bulge the vaginal area. It has been present for {NUMBER 1-10:22536} {days/wks/mos/yrs:310907}.  She {denies/ admits to:24761} seeing a bulge.  This bulge {ACTION; IS/IS GI:087931 bothersome.  Bowel Symptom: Bowel movements: *** time(s) per {Time; day/week/month:13537} Stool consistency: {stool consistency:24758} Straining: {yes/no:19897}.  Splinting: {yes/no:19897}.  Incomplete evacuation: {yes/no:19897}.  She {denies/ admits to:24761} accidental bowel leakage / fecal incontinence  Occurs: *** time(s) per {Time; day/week/month:13537}  Consistency with leakage: {stool consistency:24758} Bowel regimen: {bowel regimen:24759} Last colonoscopy: Date ***, Results ***  Sexual Function Sexually active: {yes/no:19897}.   Sexual orientation: {Sexual [phone]} Pain with sex: {pain with sex:24762}  Pelvic Pain {denies/ admits to:24761} pelvic pain Location: *** Pain occurs: *** Prior pain treatment: *** Improved by: *** Worsened by: ***   Past Medical History:  Past Medical History:  Diagnosis Date  . Depression   . Hypertension      Past Surgical History:   Past Surgical History:  Procedure Laterality Date  . leep       Past OB/GYN History: G{NUMBERS 1-10:18281} P{NUMBERS 1-10:18281} Vaginal deliveries: ***,  Forceps/ Vacuum deliveries: ***, Cesarean section: *** Menopausal: {menopausal:24763} Contraception: ***. Last pap smear was ***.  Any history of abnormal pap smears: {yes/no:19897}.   Medications: She has a current medication list which includes the following prescription(s): cyclobenzaprine, doxycycline, ergocalciferol, ferrous sulfate, ibuprofen, losartan, metronidazole, and multiple vitamins-minerals.   Allergies: Patient is allergic to prednisone, latex, penicillins, and nitrofurantoin.   Social History:  Social History   Tobacco Use  . Smoking status: Never Smoker  . Smokeless tobacco: Never Used  Substance Use Topics  . Alcohol use: Never  . Drug use: Never    Relationship status: {relationship status:24764} She lives with ***.   She {ACTION; IS/IS GI:087931 employed ***. Regular exercise: {Yes/No:304960894} History of abuse: {Yes/No:304960894}  Family History:   Family History  Problem Relation Age of Onset  . Hypertension Mother   . Hypertension Father   . Ovarian cancer Maternal Grandmother   . Throat cancer Paternal Grandfather        smoker      Review of Systems: ROS   OBJECTIVE Physical Exam: There were no vitals filed for this visit.  Physical Exam   GU / Detailed Urogynecologic Evaluation:  Pelvic Exam: Normal external female genitalia; Bartholin's and Skene's glands normal in appearance; urethral meatus normal in  appearance, no urethral masses  or discharge.   CST: {gen negative/positive:315881}  Reflexes: bulbocavernosis {DESC; PRESENT/NOT PRESENT:21021351}, anocutaneous {DESC; PRESENT/NOT PRESENT:21021351} ***bilaterally.  Speculum exam reveals normal vaginal mucosa {With/Without:20273} atrophy. Cervix {exam; gyn cervix:30847}. Uterus {exam; pelvic uterus:30849}. Adnexa {exam; adnexa:12223}.    s/p hysterectomy: Speculum exam reveals normal vaginal mucosa {With/Without:20273}  atrophy and normal vaginal cuff.  Adnexa {exam; adnexa:12223}.    With apex supported, anterior compartment defect was {reduced:24765}  Pelvic floor strength {Roman # I-V:19040}/V, puborectalis {Roman # I-V:19040}/V external anal sphincter {Roman # I-V:19040}/V  Pelvic floor musculature: Right levator {Tender/Non-tender:20250}, Right obturator {Tender/Non-tender:20250}, Left levator {Tender/Non-tender:20250}, Left obturator {Tender/Non-tender:20250}  POP-Q:   POP-Q                                               Aa                                               Ba                                                 C                                                Gh                                               Pb                                               tvl                                                Ap                                               Bp                                                 D     Rectal Exam:  Normal sphincter tone, {rectocele:24766} distal rectocele, enterocoele {DESC; PRESENT/NOT PRESENT:21021351}, no rectal masses, {sign of:24767} dyssynergia when asking the patient to bear down.  Post-Void Residual (PVR) by Bladder Scan: In order to evaluate bladder emptying, we discussed obtaining a postvoid residual and she agreed to this procedure.  Procedure: The ultrasound unit was placed  on the patient's abdomen in the suprapubic region after the patient had voided. A PVR of *** ml  was obtained by bladder scan.  Laboratory Results: @ENCLABS @   ***I visualized the urine specimen, noting the specimen to be {urine color:24768}  ASSESSMENT AND PLAN Ms. Selena Batten is a 45 y.o. with: No diagnosis found.    Jaquita Folds, MD   Medical Decision Making:  - Reviewed/ ordered a clinical laboratory test - Reviewed/ ordered a radiologic study - Reviewed/ ordered medicine test - Decision to obtain old records - Discussion of management of or test interpretation with an external physician / other healthcare professional  - Assessment requiring independent historian - Review and summation of prior records - Independent review of image, tracing or specimen

## 2020-10-22 ENCOUNTER — Ambulatory Visit: Payer: 59 | Admitting: Obstetrics and Gynecology

## 2020-11-02 IMAGING — MG DIGITAL DIAGNOSTIC BILAT W/ TOMO W/ CAD
6 series · 6 of 14 positions shown · non-contrast
Comparison: Previous exam(s).

CLINICAL DATA: 44-year-old female for further evaluation of
possible bilateral asymmetries on new baseline mammogram.

EXAM:
DIGITAL DIAGNOSTIC BILATERAL MAMMOGRAM WITH CAD AND TOMO

[R CC synth-2D (1 of 2)]
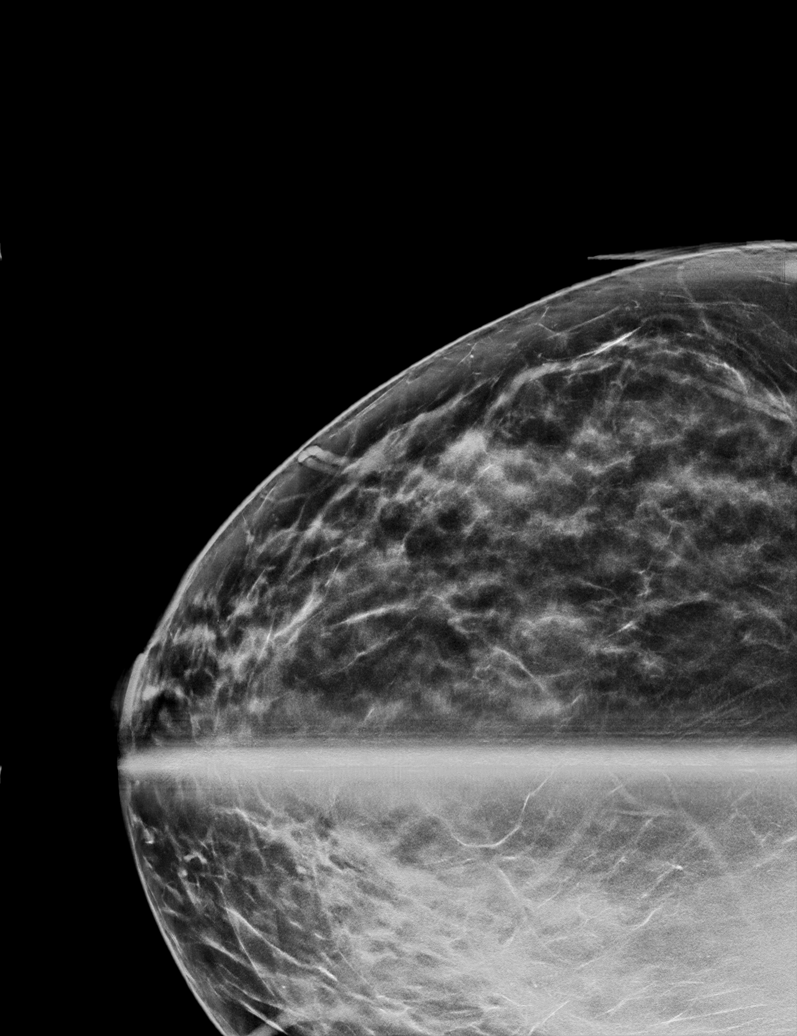

[R MLO synth-2D]
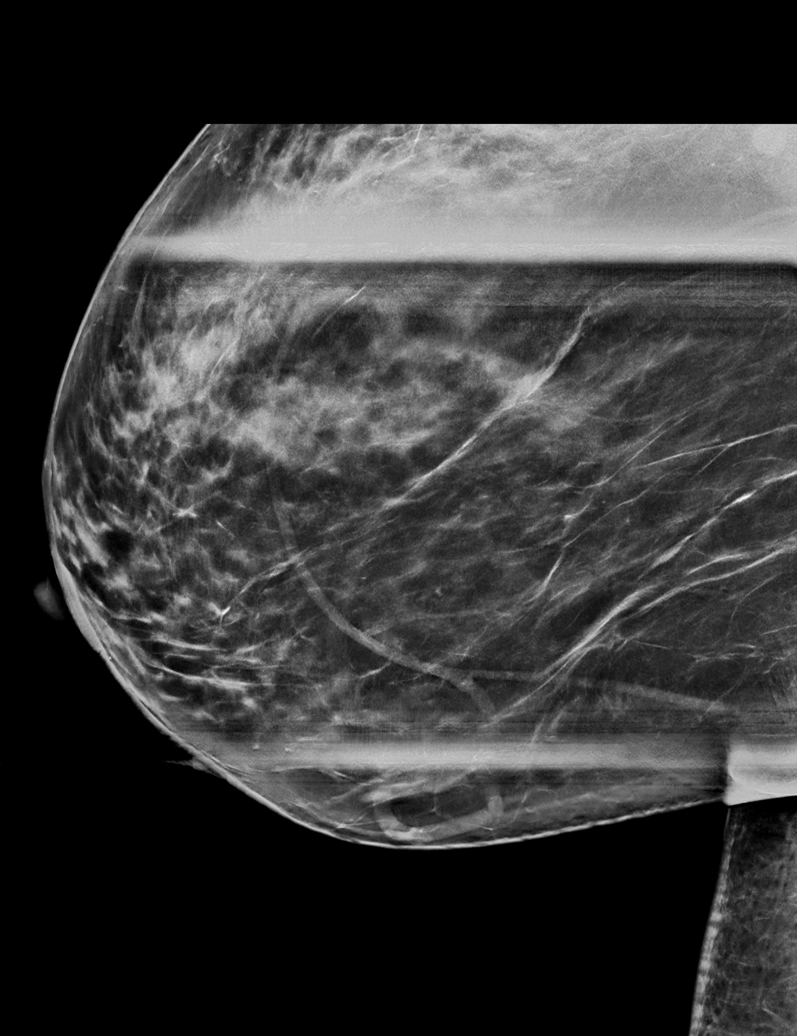

[R CC synth-2D (2 of 2)]
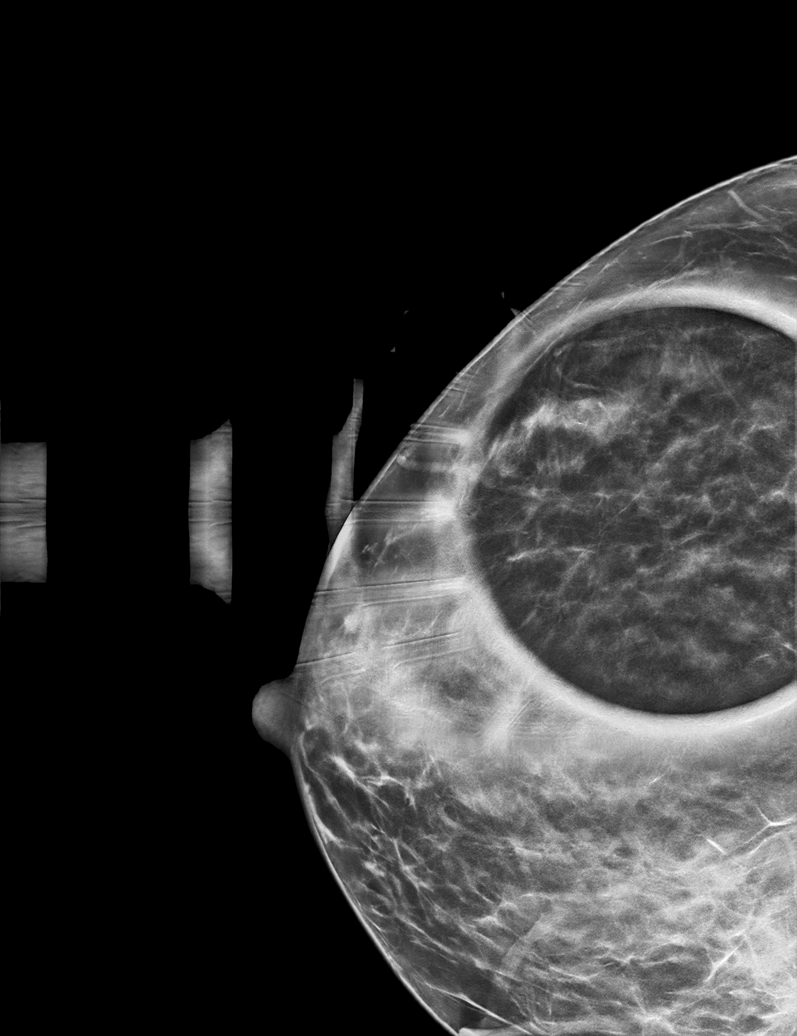

[L ML synth-2D]
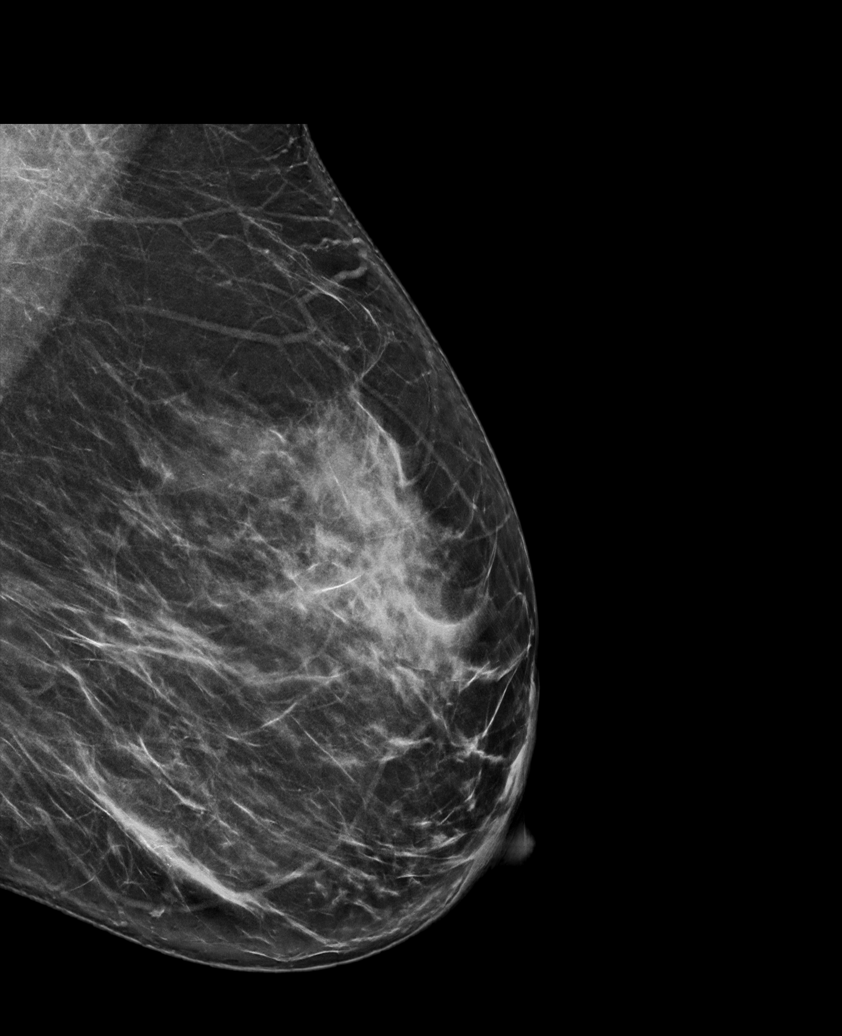

[R CC tomo · tomo slice 31/62.0]
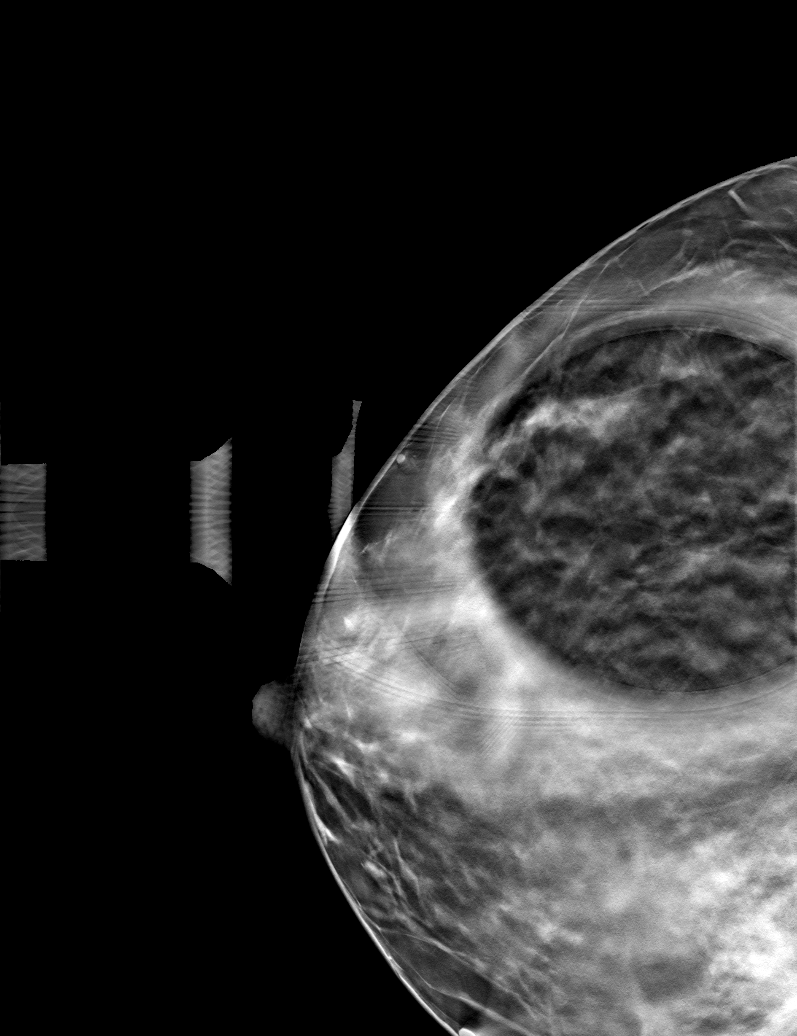

[L ML tomo · tomo slice 39/77.0]
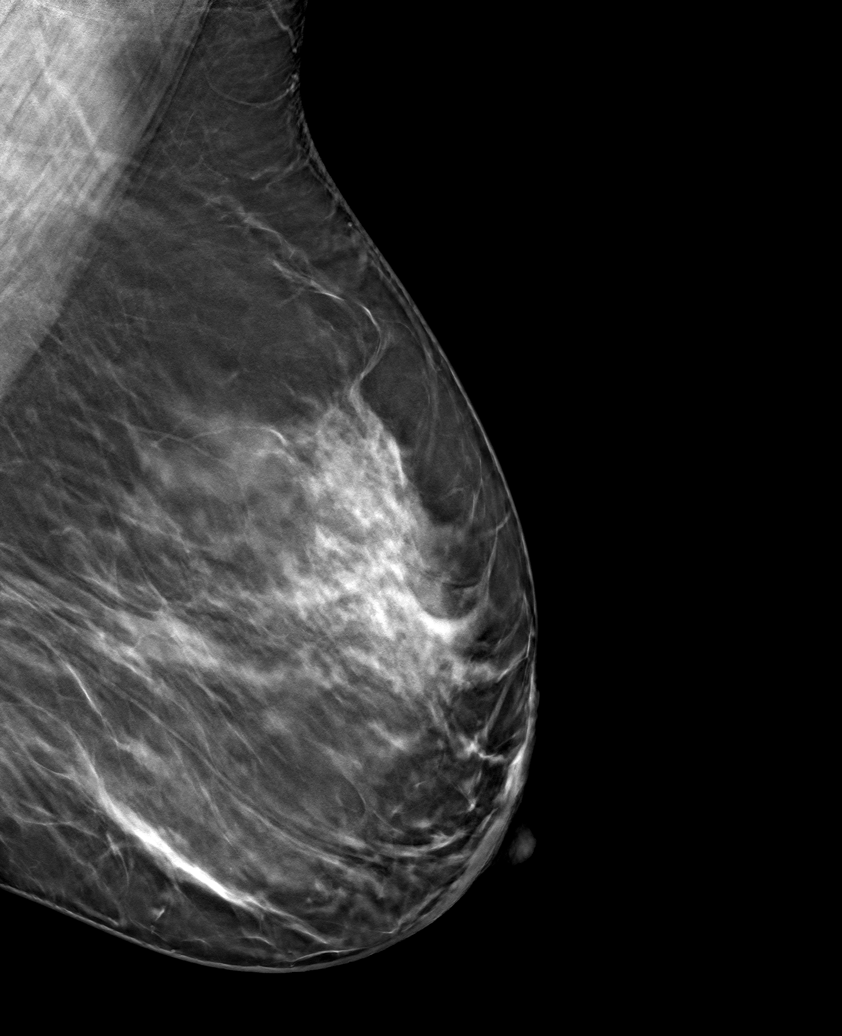

[6 of 14 positions shown; findings below may reference images not displayed]

ACR Breast Density Category c: The breast tissue is heterogeneously
dense, which may obscure small masses.
FINDINGS: 2D/3D full field and spot compression views of both breasts
demonstrate no persistent abnormalities at the site of the screening
study findings.

Mammographic images were processed with CAD.
IMPRESSION: No persistent abnormalities at the site of the screening study
findings.

RECOMMENDATION:
Bilateral screening mammogram in 1 year.

I have discussed the findings and recommendations with the patient.
If applicable, a reminder letter will be sent to the patient
regarding the next appointment.

BI-RADS CATEGORY  1: Negative.

## 2020-11-10 ENCOUNTER — Encounter: Payer: Self-pay | Admitting: Nurse Practitioner

## 2020-11-23 ENCOUNTER — Encounter: Payer: Self-pay | Admitting: Nurse Practitioner

## 2020-11-25 ENCOUNTER — Telehealth: Payer: Self-pay

## 2020-11-25 NOTE — Telephone Encounter (Signed)
Attempt made to contact Catherine Garza is a 45 y.o. female re: New pt Pre appt call to collect history information.  -Allergy -Medication -Confirm pharmacy -OB history   Pt was not available. LM on the VM for the patient to call back

## 2020-11-25 NOTE — Progress Notes (Deleted)
[facility] New Patient Evaluation and Consultation  Referring Provider: Sloan Leiter, MD PCP: Gildardo Pounds, NP Date of Service: 11/26/2020  SUBJECTIVE Chief Complaint: No chief complaint on file.  History of Present Illness: Catherine Garza is a 45 y.o. {ED SANE [phone] female seen in consultation at the request of Dr. Rosana Hoes for evaluation of stress incontinence and prolapse.    Review of records significant for: Reports leakage of urine, vaginal odor. Recently had IUD removed.   Urinary Symptoms: {urine leakage?:24754} Leaks *** time(s) per {days/wks/mos/yrs:310907}.  Pad use: {NUMBERS 1-10:18281} {pad option:24752} per day.   She {ACTION; IS/IS VOH:60737106} bothered by her UI symptoms.  Day time voids ***.  Nocturia: *** times per night to void. Voiding dysfunction: she {empties:24755} her bladder well.  {DOES NOT does:27190::"does not"} use a catheter to empty bladder.  When urinating, she feels {urine symptoms:24756} Drinks: *** per day  UTIs: {NUMBERS 1-10:18281} UTI's in the last year.   {ACTIONS;DENIES/REPORTS:21021675::"Denies"} history of {urologic concerns:24757}  Pelvic Organ Prolapse Symptoms:                  She {denies/ admits to:24761} a feeling of a bulge the vaginal area. It has been present for {NUMBER 1-10:22536} {days/wks/mos/yrs:310907}.  She {denies/ admits to:24761} seeing a bulge.  This bulge {ACTION; IS/IS YIR:48546270} bothersome.  Bowel Symptom: Bowel movements: *** time(s) per {Time; day/week/month:13537} Stool consistency: {stool consistency:24758} Straining: {yes/no:19897}.  Splinting: {yes/no:19897}.  Incomplete evacuation: {yes/no:19897}.  She {denies/ admits to:24761} accidental bowel leakage / fecal incontinence  Occurs: *** time(s) per {Time; day/week/month:13537}  Consistency with leakage: {stool consistency:24758} Bowel regimen: {bowel regimen:24759} Last colonoscopy: Date ***, Results ***  Sexual  Function Sexually active: {yes/no:19897}.  Sexual orientation: {Sexual Orientation:[phone]} Pain with sex: {pain with sex:24762}  Pelvic Pain {denies/ admits to:24761} pelvic pain Location: *** Pain occurs: *** Prior pain treatment: *** Improved by: *** Worsened by: ***   Past Medical History:  Past Medical History:  Diagnosis Date  . Depression   . Hypertension      Past Surgical History:   Past Surgical History:  Procedure Laterality Date  . leep       Past OB/GYN History: G{NUMBERS 1-10:18281} P{NUMBERS 1-10:18281} Vaginal deliveries: ***,  Forceps/ Vacuum deliveries: ***, Cesarean section: *** Menopausal: {menopausal:24763} Contraception: ***. Last pap smear was ***.  Any history of abnormal pap smears: {yes/no:19897}.   Medications: She has a current medication list which includes the following prescription(s): cyclobenzaprine, doxycycline, ergocalciferol, ferrous sulfate, ibuprofen, losartan, metronidazole, and multiple vitamins-minerals.   Allergies: Patient is allergic to prednisone, latex, penicillins, and nitrofurantoin.   Social History:  Social History   Tobacco Use  . Smoking status: Never Smoker  . Smokeless tobacco: Never Used  Substance Use Topics  . Alcohol use: Never  . Drug use: Never    Relationship status: {relationship status:24764} She lives with ***.   She {ACTION; IS/IS JJK:09381829} employed ***. Regular exercise: {Yes/No:304960894} History of abuse: {Yes/No:304960894}  Family History:   Family History  Problem Relation Age of Onset  . Hypertension Mother   . Hypertension Father   . Ovarian cancer Maternal Grandmother   . Throat cancer Paternal Grandfather        smoker      Review of Systems: ROS   OBJECTIVE Physical Exam: There were no vitals filed for this visit.  Physical Exam   GU / Detailed Urogynecologic Evaluation:  Pelvic Exam: Normal external female genitalia; Bartholin's and Skene's glands normal  in appearance; urethral meatus normal  in appearance, no urethral masses or discharge.   CST: {gen negative/positive:315881}  Reflexes: bulbocavernosis {DESC; PRESENT/NOT PRESENT:21021351}, anocutaneous {DESC; PRESENT/NOT PRESENT:21021351} ***bilaterally.  Speculum exam reveals normal vaginal mucosa {With/Without:20273} atrophy. Cervix {exam; gyn cervix:30847}. Uterus {exam; pelvic uterus:30849}. Adnexa {exam; adnexa:12223}.    s/p hysterectomy: Speculum exam reveals normal vaginal mucosa {With/Without:20273}  atrophy and normal vaginal cuff.  Adnexa {exam; adnexa:12223}.    With apex supported, anterior compartment defect was {reduced:24765}  Pelvic floor strength {Roman # I-V:19040}/V, puborectalis {Roman # I-V:19040}/V external anal sphincter {Roman # I-V:19040}/V  Pelvic floor musculature: Right levator {Tender/Non-tender:20250}, Right obturator {Tender/Non-tender:20250}, Left levator {Tender/Non-tender:20250}, Left obturator {Tender/Non-tender:20250}  POP-Q:   POP-Q                                               Aa                                               Ba                                                 C                                                Gh                                               Pb                                               tvl                                                Ap                                               Bp                                                 D     Rectal Exam:  Normal sphincter tone, {rectocele:24766} distal rectocele, enterocoele {DESC; PRESENT/NOT PRESENT:21021351}, no rectal masses, {sign of:24767} dyssynergia when asking the patient to bear down.  Post-Void Residual (PVR) by Bladder Scan: In order to evaluate bladder emptying, we discussed obtaining a postvoid residual and she agreed to this procedure.  Procedure:  The ultrasound unit was placed on the patient's abdomen in the suprapubic region after  the patient had voided. A PVR of *** ml was obtained by bladder scan.  Laboratory Results: @ENCLABS @   ***I visualized the urine specimen, noting the specimen to be {urine color:24768}  ASSESSMENT AND PLAN [name] is a 45 y.o. with: No diagnosis found.    Jaquita Folds, MD   Medical Decision Making:  - Reviewed/ ordered a clinical laboratory test - Reviewed/ ordered a radiologic study - Reviewed/ ordered medicine test - Decision to obtain old records - Discussion of management of or test interpretation with an external physician / other healthcare professional  - Assessment requiring independent historian - Review and summation of prior records - Independent review of image, tracing or specimen

## 2020-11-26 ENCOUNTER — Ambulatory Visit: Payer: Medicaid Other | Admitting: Obstetrics and Gynecology

## 2021-01-01 MED FILL — Losartan Potassium Tab 50 MG: ORAL | 30 days supply | Qty: 30 | Fill #0 | Status: CN

## 2021-01-03 ENCOUNTER — Other Ambulatory Visit: Payer: Self-pay

## 2021-01-10 ENCOUNTER — Other Ambulatory Visit: Payer: Self-pay

## 2021-01-24 ENCOUNTER — Ambulatory Visit: Payer: 59

## 2021-01-24 ENCOUNTER — Other Ambulatory Visit: Payer: Self-pay

## 2021-01-24 ENCOUNTER — Other Ambulatory Visit (HOSPITAL_COMMUNITY)
Admission: RE | Admit: 2021-01-24 | Discharge: 2021-01-24 | Disposition: A | Discharge status: Home / Self Care | Payer: Medicaid Other | Source: Ambulatory Visit | Attending: Obstetrics | Admitting: Obstetrics

## 2021-01-24 VITALS — BP 135/92 | HR 72

## 2021-01-24 DIAGNOSIS — N898 Other specified noninflammatory disorders of vagina: Secondary | ICD-10-CM | POA: Diagnosis present

## 2021-01-24 NOTE — Progress Notes (Signed)
SUBJECTIVE:  45 y.o. female complains of vag discharge for a couple of days. Denies abnormal vaginal bleeding or significant pelvic pain or fever. No UTI symptoms. Denies history of known exposure to STD. Patient request yeast and BV with cultures.  No LMP recorded.  OBJECTIVE:  She appears well, afebrile. Urine dipstick: N/A     ASSESSMENT:  Vaginal Discharge: small amount  Vaginal Odor: small amount    PLAN:  GC, chlamydia, trichomonas, BVAG, CVAG probe sent to lab. Treatment: To be determined once lab results are received ROV prn if symptoms persist or worsen.

## 2021-01-25 ENCOUNTER — Other Ambulatory Visit: Payer: Self-pay | Admitting: Obstetrics and Gynecology

## 2021-01-25 ENCOUNTER — Encounter: Payer: Self-pay | Admitting: Nurse Practitioner

## 2021-01-25 ENCOUNTER — Other Ambulatory Visit: Payer: Self-pay

## 2021-01-25 LAB — CERVICOVAGINAL ANCILLARY ONLY
Bacterial Vaginitis (gardnerella): NEGATIVE
Candida Glabrata: NEGATIVE
Candida Vaginitis: POSITIVE — AB
Chlamydia: NEGATIVE
Comment: NEGATIVE
Comment: NEGATIVE
Comment: NEGATIVE
Comment: NEGATIVE
Comment: NEGATIVE
Comment: NORMAL
Neisseria Gonorrhea: NEGATIVE
Trichomonas: NEGATIVE

## 2021-01-25 MED ORDER — FLUCONAZOLE 150 MG PO TABS
150.0000 mg | ORAL_TABLET | Freq: Once | ORAL | 0 refills | Status: AC
  Filled 2021-01-25: qty 1, 1d supply, fill #0

## 2021-01-26 ENCOUNTER — Other Ambulatory Visit: Payer: Self-pay

## 2021-01-26 MED FILL — Losartan Potassium Tab 50 MG: ORAL | 30 days supply | Qty: 30 | Fill #0 | Status: AC

## 2021-04-03 ENCOUNTER — Other Ambulatory Visit: Payer: Self-pay | Admitting: Nurse Practitioner

## 2021-04-03 DIAGNOSIS — I10 Essential (primary) hypertension: Secondary | ICD-10-CM

## 2021-04-03 NOTE — Telephone Encounter (Signed)
Last RF 04/09/20 #90 Prescription ends 04/09/21  RF is due. No FVS. Labs overdue. MyChart message sent to pt to call office to make appt. Office number provided.  Requested Prescriptions  Pending Prescriptions Disp Refills   losartan (COZAAR) 50 MG tablet 90 tablet 0    Sig: Take by mouth daily.      Cardiovascular:  Angiotensin Receptor Blockers Failed - 04/03/2021  1:15 AM      Failed - Cr in normal range and within 180 days    Creatinine, Ser  Date Value Ref Range Status  04/09/2020 0.75 0.57 - 1.00 mg/dL Final          Failed - K in normal range and within 180 days    Potassium  Date Value Ref Range Status  04/09/2020 4.0 3.5 - 5.2 mmol/L Final          Failed - Last BP in normal range    BP Readings from Last 1 Encounters:  01/24/21 (!) 135/92          Failed - Valid encounter within last 6 months    Recent Outpatient Visits           10 months ago Essential hypertension   [place], Vernia Buff, NP   11 months ago Encounter to establish care   [place], Vernia Buff, NP                Passed - Patient is not pregnant

## 2021-04-11 ENCOUNTER — Other Ambulatory Visit: Payer: Self-pay

## 2021-04-20 ENCOUNTER — Telehealth: Payer: Self-pay

## 2021-04-20 ENCOUNTER — Other Ambulatory Visit: Payer: Self-pay

## 2021-04-20 ENCOUNTER — Other Ambulatory Visit (HOSPITAL_COMMUNITY)
Admission: RE | Admit: 2021-04-20 | Discharge: 2021-04-20 | Disposition: A | Discharge status: Home / Self Care | Payer: 59 | Source: Ambulatory Visit | Attending: Obstetrics and Gynecology | Admitting: Obstetrics and Gynecology

## 2021-04-20 ENCOUNTER — Ambulatory Visit (INDEPENDENT_AMBULATORY_CARE_PROVIDER_SITE_OTHER): Payer: 59

## 2021-04-20 VITALS — BP 148/94 | HR 78 | Ht 67.5 in | Wt 191.0 lb

## 2021-04-20 DIAGNOSIS — R350 Frequency of micturition: Secondary | ICD-10-CM

## 2021-04-20 DIAGNOSIS — N898 Other specified noninflammatory disorders of vagina: Secondary | ICD-10-CM | POA: Insufficient documentation

## 2021-04-20 LAB — POCT URINALYSIS DIPSTICK
Bilirubin, UA: NEGATIVE
Blood, UA: POSITIVE
Glucose, UA: NEGATIVE
Nitrite, UA: NEGATIVE
Protein, UA: POSITIVE — AB
Spec Grav, UA: 1.015 (ref 1.010–1.025)
Urobilinogen, UA: 0.2 E.U./dL
pH, UA: 5 (ref 5.0–8.0)

## 2021-04-20 NOTE — Telephone Encounter (Signed)
Return call to pt regarding VM Pt states she  saw POCT UA and did not understand results Pt advised we have to wait on the Urine Cx to confirm if she has a UTI and how to treat if so test was just done today Pt voiced understanding.

## 2021-04-20 NOTE — Progress Notes (Signed)
SUBJECTIVE:  45 y.o. female complains of malodorous, yellowish and thick vaginal discharge for 2 week(s). Denies abnormal vaginal bleeding or significant pelvic pain or fever. No UTI symptoms. Denies history of known exposure to STD.  Patient's last menstrual period was 03/31/2021 (exact date).  OBJECTIVE:  She appears well, afebrile. Urine dipstick: positive for RBC's, positive for protein, positive for leukocytes, and positive for ketones.  ASSESSMENT:  Vaginal Discharge  Vaginal Odor Urinary frequency, not fully emptying her bladder.   PLAN:  GC, chlamydia, trichomonas, BVAG, CVAG probe, urine culture sent to lab. Treatment: To be determined once lab results are received ROV prn if symptoms persist or worsen.

## 2021-04-20 NOTE — Progress Notes (Signed)
Agree with A & P. 

## 2021-04-22 LAB — CERVICOVAGINAL ANCILLARY ONLY
Bacterial Vaginitis (gardnerella): NEGATIVE
Candida Glabrata: NEGATIVE
Candida Vaginitis: NEGATIVE
Chlamydia: NEGATIVE
Comment: NEGATIVE
Comment: NEGATIVE
Comment: NEGATIVE
Comment: NEGATIVE
Comment: NEGATIVE
Comment: NORMAL
Neisseria Gonorrhea: NEGATIVE
Trichomonas: NEGATIVE

## 2021-04-22 LAB — URINE CULTURE

## 2021-05-03 ENCOUNTER — Ambulatory Visit: Payer: Self-pay | Admitting: *Deleted

## 2021-05-03 NOTE — Telephone Encounter (Signed)
Pt reports abdominal pain, "Below rib cage, across from navel on right side." Pt is evasive historian. States can not verbalize how long she has had this pain "A while." Intermittent, "Some days constant. States cannot rate on scale of 1-10. States had been constipated but resolved "Drank more water." Pt seen for UTI, states negative "But not sure it was done right." Pt has appt for physical in Sept, asking to be seen sooner.   Pt distracted during call,typing on keyboard audible, asking me to hold. Advised pt NT would route to practice for PCPs review and final disposition. Advised ED for worsening symptoms. Pt verbalizes understanding.     Reason for Disposition  Abdominal pain is a chronic symptom (recurrent or ongoing AND present > 4 weeks)  Protocols used: Abdominal Pain - Female-A-AH

## 2021-05-04 ENCOUNTER — Encounter: Payer: Self-pay | Admitting: Physician Assistant

## 2021-05-04 ENCOUNTER — Other Ambulatory Visit: Payer: Self-pay

## 2021-05-04 ENCOUNTER — Ambulatory Visit: Payer: 59 | Attending: Physician Assistant | Admitting: Physician Assistant

## 2021-05-04 VITALS — BP 138/98 | HR 88 | Ht 67.5 in | Wt 190.6 lb

## 2021-05-04 DIAGNOSIS — I10 Essential (primary) hypertension: Secondary | ICD-10-CM

## 2021-05-04 DIAGNOSIS — R8290 Unspecified abnormal findings in urine: Secondary | ICD-10-CM | POA: Diagnosis not present

## 2021-05-04 DIAGNOSIS — R1011 Right upper quadrant pain: Secondary | ICD-10-CM | POA: Diagnosis not present

## 2021-05-04 DIAGNOSIS — Z3202 Encounter for pregnancy test, result negative: Secondary | ICD-10-CM | POA: Diagnosis not present

## 2021-05-04 LAB — POCT URINALYSIS DIP (CLINITEK)
Bilirubin, UA: NEGATIVE
Glucose, UA: NEGATIVE mg/dL
Ketones, POC UA: NEGATIVE mg/dL
Nitrite, UA: NEGATIVE
POC PROTEIN,UA: 30 — AB
Spec Grav, UA: 1.02 (ref 1.010–1.025)
Urobilinogen, UA: 0.2 E.U./dL
pH, UA: 7 (ref 5.0–8.0)

## 2021-05-04 LAB — POCT URINE PREGNANCY: Preg Test, Ur: NEGATIVE

## 2021-05-04 MED ORDER — LOSARTAN POTASSIUM 50 MG PO TABS
ORAL_TABLET | Freq: Every day | ORAL | 0 refills | Status: DC
  Filled 2021-05-04: qty 90, 90d supply, fill #0

## 2021-05-04 MED ORDER — SULFAMETHOXAZOLE-TRIMETHOPRIM 800-160 MG PO TABS
1.0000 | ORAL_TABLET | Freq: Two times a day (BID) | ORAL | 0 refills | Status: DC
  Filled 2021-05-04: qty 10, 5d supply, fill #0

## 2021-05-04 MED ORDER — FLUCONAZOLE 150 MG PO TABS
150.0000 mg | ORAL_TABLET | Freq: Once | ORAL | 0 refills | Status: AC
  Filled 2021-05-04: qty 1, 1d supply, fill #0

## 2021-05-04 NOTE — Progress Notes (Signed)
[name], female   DOB: [birth year], 45 y.o.   MRN: [id number]    Catherine Garza, is a 45 y.o. female  [id number]  [id number]  DOB - 02-18-1976  Chief Complaint  Patient presents with   Abdominal Pain       Subjective:   Catherine Garza is a 45 y.o. female here today for a follow up visit and to establish care. Patient c/o RUQ and RLQ pain that is mild for the last 2 weeks.  She is concerned bc she has had recurrent UTI/BV and wants to be checked.  No fever.  No N/V/D.  No vaginal discharge(just had swabs 2 weeks ago.).  not currently SA.  Husband not in town.  Not on BC.  Out of BP meds.  No dysuria. No problems updated.  ALLERGIES: Allergies  Allergen Reactions   Prednisone Other (See Comments)    Caused her to have blood in her stool   Latex Hives   Penicillins Hives   Nitrofurantoin Other (See Comments)    Severe pain    PAST MEDICAL HISTORY: Past Medical History:  Diagnosis Date   Depression    Hypertension     MEDICATIONS AT HOME: Prior to Admission medications   Medication Sig Start Date End Date Taking? Authorizing Provider  fluconazole (DIFLUCAN) 150 MG tablet Take 1 tablet (150 mg total) by mouth once for 1 dose. 05/04/21 05/04/21 Yes , Dionne Bucy, PA-C  Multiple Vitamins-Minerals (WOMENS MULTI GUMMIES PO) Take by mouth.   Yes [provider]  sulfamethoxazole-trimethoprim (BACTRIM DS) 800-160 MG tablet Take 1 tablet by mouth 2 (two) times daily. 05/04/21  Yes ,  M, PA-C  cyclobenzaprine (FLEXERIL) 10 MG tablet cyclobenzaprine 10 mg tablet Patient not taking: No sig reported    [provider]  doxycycline (DORYX) 100 MG EC tablet Take 100 mg by mouth 2 (two) times daily. Patient not taking: No sig reported    [provider]  ergocalciferol (VITAMIN D2) 1.25 MG (50000 UT) capsule Vitamin D2 1,250 mcg (50,000 unit) capsule  Take 1 capsule every week by oral route. Patient not taking: Reported on  05/04/2021    [provider]  ferrous sulfate 325 (65 FE) MG tablet Take 1 tablet (325 mg total) by mouth daily with breakfast. Patient not taking: No sig reported 04/09/20   Gildardo Pounds, NP  ibuprofen (ADVIL) 800 MG tablet ibuprofen 800 mg tablet Patient not taking: No sig reported    [provider]  losartan (COZAAR) 50 MG tablet TAKE 1 TABLET (50 MG TOTAL) BY MOUTH DAILY. 05/04/21 05/04/22  Argentina Donovan, PA-C  metroNIDAZOLE (FLAGYL) 500 MG tablet Take 500 mg by mouth 3 (three) times daily. Patient not taking: No sig reported    [provider]    ROS: Neg HEENT Neg resp Neg cardiac Neg GU Neg MS Neg psych Neg neuro  Objective:   Vitals:   05/04/21 1423  BP: (!) 138/98  Pulse: 88  SpO2: 99%  Weight: 190 lb 9.6 oz (86.5 kg)  Height: 5' 7.5" (1.715 m)   Exam General appearance : Awake, alert, not in any distress. Speech Clear. Not toxic looking HEENT: Atraumatic and Normocephalic, pupils equally reactive to light and accomodation Neck: Supple, no JVD. No cervical lymphadenopathy.  Chest: Good air entry bilaterally, CTA B  CVS: S1 S2 regular, no murmurs.  Abdomen: Bowel sounds present, Non tender and not distended with no gaurding, rigidity or rebound. Extremities: B/L Lower Ext shows no  edema, both legs are warm to touch Neurology: Awake alert, and oriented X 3, CN II-XII intact, Non focal Skin: No Rash  Data Review Lab Results  Component Value Date   HGBA1C 5.2 04/09/2020    Assessment & Plan   1. Right upper quadrant abdominal pain Non acute abdomen - POCT URINALYSIS DIP (CLINITEK) - POCT urine pregnancy - Comprehensive metabolic panel - CBC with Differential/Platelet  2. Essential hypertension Uncontrolled-resume meds - Comprehensive metabolic panel - CBC with Differential/Platelet - losartan (COZAAR) 50 MG tablet; TAKE 1 TABLET (50 MG TOTAL) BY MOUTH DAILY.  Dispense: 90 tablet; Refill: 0  3. Cloudy urine Increase  water intake.   - Urine Culture - sulfamethoxazole-trimethoprim (BACTRIM DS) 800-160 MG tablet; Take 1 tablet by mouth 2 (two) times daily.  Dispense: 10 tablet; Refill: 0    Patient have been counseled extensively about nutrition and exercise. Other issues discussed during this visit include: low cholesterol diet, weight control and daily exercise, foot care, annual eye examinations at Ophthalmology, importance of adherence with medications and regular follow-up. We also discussed long term complications of uncontrolled diabetes and hypertension.   Return for keep 9/19 appt with zelda.  The patient was given clear instructions to go to ER or return to medical center if symptoms don't improve, worsen or new problems develop. The patient verbalized understanding. The patient was told to call to get lab results if they haven't heard anything in the next week.      Freeman Caldron, PA-C [facility], [place]   05/04/2021, 2:48 PM

## 2021-05-04 NOTE — Telephone Encounter (Signed)
Called patient and left vm to see if a sooner appointment is need. Advise to call [phone] to schedule

## 2021-05-05 LAB — CBC WITH DIFFERENTIAL/PLATELET
Basophils Absolute: 0.1 10*3/uL (ref 0.0–0.2)
Basos: 1 %
EOS (ABSOLUTE): 0.1 10*3/uL (ref 0.0–0.4)
Eos: 1 %
Hematocrit: 43.4 % (ref 34.0–46.6)
Hemoglobin: 14.5 g/dL (ref 11.1–15.9)
Immature Grans (Abs): 0 10*3/uL (ref 0.0–0.1)
Immature Granulocytes: 0 %
Lymphocytes Absolute: 3 10*3/uL (ref 0.7–3.1)
Lymphs: 24 %
MCH: 28.5 pg (ref 26.6–33.0)
MCHC: 33.4 g/dL (ref 31.5–35.7)
MCV: 85 fL (ref 79–97)
Monocytes Absolute: 0.5 10*3/uL (ref 0.1–0.9)
Monocytes: 4 %
Neutrophils Absolute: 9.2 10*3/uL — ABNORMAL HIGH (ref 1.4–7.0)
Neutrophils: 70 %
Platelets: 319 10*3/uL (ref 150–450)
RBC: 5.09 x10E6/uL (ref 3.77–5.28)
RDW: 13 % (ref 11.7–15.4)
WBC: 12.9 10*3/uL — ABNORMAL HIGH (ref 3.4–10.8)

## 2021-05-05 LAB — COMPREHENSIVE METABOLIC PANEL
ALT: 18 IU/L (ref 0–32)
AST: 14 IU/L (ref 0–40)
Albumin/Globulin Ratio: 1.5 (ref 1.2–2.2)
Albumin: 4.8 g/dL (ref 3.8–4.8)
Alkaline Phosphatase: 97 IU/L (ref 44–121)
BUN/Creatinine Ratio: 7 — ABNORMAL LOW (ref 9–23)
BUN: 5 mg/dL — ABNORMAL LOW (ref 6–24)
Bilirubin Total: 0.2 mg/dL (ref 0.0–1.2)
CO2: 22 mmol/L (ref 20–29)
Calcium: 9.4 mg/dL (ref 8.7–10.2)
Chloride: 101 mmol/L (ref 96–106)
Creatinine, Ser: 0.7 mg/dL (ref 0.57–1.00)
Globulin, Total: 3.1 g/dL (ref 1.5–4.5)
Glucose: 95 mg/dL (ref 65–99)
Potassium: 3.8 mmol/L (ref 3.5–5.2)
Sodium: 139 mmol/L (ref 134–144)
Total Protein: 7.9 g/dL (ref 6.0–8.5)
eGFR: 109 mL/min/{1.73_m2} (ref >59)

## 2021-05-06 LAB — URINE CULTURE

## 2021-06-13 ENCOUNTER — Encounter: Payer: 59 | Admitting: Nurse Practitioner

## 2021-07-27 ENCOUNTER — Encounter: Payer: 59 | Admitting: Nurse Practitioner

## 2021-08-08 ENCOUNTER — Other Ambulatory Visit: Payer: Self-pay | Admitting: Physician Assistant

## 2021-08-08 DIAGNOSIS — I10 Essential (primary) hypertension: Secondary | ICD-10-CM

## 2021-08-08 MED ORDER — LOSARTAN POTASSIUM 50 MG PO TABS
ORAL_TABLET | Freq: Every day | ORAL | 0 refills | Status: DC
  Filled 2021-08-08 – 2021-08-31 (×3): qty 90, 90d supply, fill #0

## 2021-08-08 NOTE — Telephone Encounter (Signed)
Requested Prescriptions  Pending Prescriptions Disp Refills  . losartan (COZAAR) 50 MG tablet 90 tablet 0    Sig: TAKE 1 TABLET (50 MG TOTAL) BY MOUTH DAILY.     Cardiovascular:  Angiotensin Receptor Blockers Failed - 08/08/2021  3:17 PM      Failed - Last BP in normal range    BP Readings from Last 1 Encounters:  05/04/21 (!) 138/98         Passed - Cr in normal range and within 180 days    Creatinine, Ser  Date Value Ref Range Status  05/04/2021 0.70 0.57 - 1.00 mg/dL Final         Passed - K in normal range and within 180 days    Potassium  Date Value Ref Range Status  05/04/2021 3.8 3.5 - 5.2 mmol/L Final         Passed - Patient is not pregnant      Passed - Valid encounter within last 6 months    Recent Outpatient Visits          3 months ago Right upper quadrant abdominal pain   [place], [place], [place]   1 year ago Essential hypertension   [place], Vernia Buff, NP   1 year ago Encounter to establish care   [place], Vernia Buff, NP

## 2021-08-09 ENCOUNTER — Other Ambulatory Visit: Payer: Self-pay

## 2021-08-15 ENCOUNTER — Other Ambulatory Visit: Payer: Self-pay

## 2021-08-22 ENCOUNTER — Other Ambulatory Visit: Payer: Self-pay

## 2021-08-23 ENCOUNTER — Other Ambulatory Visit: Payer: Self-pay

## 2021-08-29 ENCOUNTER — Other Ambulatory Visit: Payer: Self-pay

## 2021-08-31 ENCOUNTER — Other Ambulatory Visit: Payer: Self-pay
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
## 2021-10-05 ENCOUNTER — Encounter: Payer: Self-pay | Admitting: Obstetrics and Gynecology

## 2021-10-05 ENCOUNTER — Other Ambulatory Visit: Payer: Self-pay

## 2021-10-05 ENCOUNTER — Ambulatory Visit: Payer: 59 | Admitting: Obstetrics and Gynecology

## 2021-10-05 VITALS — BP 169/107 | HR 79 | Ht 66.0 in | Wt 191.0 lb

## 2021-10-05 DIAGNOSIS — N3946 Mixed incontinence: Secondary | ICD-10-CM

## 2021-10-05 DIAGNOSIS — R350 Frequency of micturition: Secondary | ICD-10-CM

## 2021-10-05 LAB — POCT URINALYSIS DIPSTICK
Appearance: NORMAL
Bilirubin, UA: NEGATIVE
Glucose, UA: NEGATIVE
Ketones, UA: NEGATIVE
Leukocytes, UA: NEGATIVE
Nitrite, UA: NEGATIVE
Protein, UA: POSITIVE — AB
Spec Grav, UA: >=1.03 — AB (ref 1.010–1.025)
Urobilinogen, UA: 0.2 E.U./dL
pH, UA: 6 (ref 5.0–8.0)

## 2021-10-05 NOTE — Patient Instructions (Addendum)
Today we talked about ways to manage bladder urgency such as altering your diet to avoid irritative beverages and foods (bladder diet) as well as attempting to decrease stress and other exacerbating factors.  Goal is 60oz water.   The Most Bothersome Foods* The Least Bothersome Foods*  Coffee - Regular & Decaf Tea - caffeinated Carbonated beverages - cola, non-colas, diet & caffeine-free Alcohols - Beer, Red Wine, White Wine, Champagne Fruits - Grapefruit, [place], Orange, [employer] - Cranberry, Grapefruit, Orange, Pineapple Vegetables - Tomato & Tomato Products Flavor Enhancers - Hot peppers, Spicy foods, Chili, Horseradish, Vinegar, Monosodium glutamate (MSG) Artificial Sweeteners - NutraSweet, Sweet 'N Low, Equal (sweetener), Saccharin Ethnic foods - [place], [place], [place] food Express Scripts - low-fat & whole Fruits - Bananas, Blueberries, Honeydew melon, Pears, Raisins, Watermelon Vegetables - Broccoli, Brussels Sprouts, [place], Carrots, Cauliflower, [place], Cucumber, Mushrooms, Peas, Radishes, Squash, Zucchini, White potatoes, Sweet potatoes & yams Poultry - Chicken, Eggs, [place], [employer] - Beef, Programmer, multimedia, Lamb Seafood - Shrimp, [place] fish, Salmon Grains - Oat, Rice Snacks - Pretzels, Popcorn  *Lissa Morales et al. Diet and its role in interstitial cystitis/bladder pain syndrome (IC/BPS) and comorbid conditions. [place] 2012 Jan 11.   URODYNAMICS (UDS) TEST INFORMATION  IMPORTANT: Please try to arrive with a comfortably full bladder!  Complete a 3 day bladder diary prior to your appointment.  What is UDS? Urodynamics is a bladder test used to evaluate how your bladder and urethra (tube you urinate out of) work to help find out the cause of your bladder symptoms and evaluate your bladder function in order to make the best treatment plan for you.   What to expect? A nurse will perform the test and will be with you during the entire exam. First we will have to  empty your bladder on a special toilet.  After you have emptied your bladder, very small catheters (plastic tubing) will be placed into your bladder and into your vagina (or rectum). These special small catheters measure pressure to help measure your bladder function.  Your bladder will be gently filled with water and you will be asked to cough and strain at several different points during the test.   You will then be asked to empty your bladder in the special toilet with the catheters in place. Most patients can urinate (pee) easily with the catheters in place since the catheters are so small. In total this procedure lasts about 45 minutes to 1 hour.  After your test is completed, you will return (or possibly be seen the same day) to review the results, talk about treatment options and make a plan moving forward.

## 2021-11-08 ENCOUNTER — Other Ambulatory Visit: Payer: Self-pay | Admitting: Nurse Practitioner

## 2021-11-08 ENCOUNTER — Telehealth (INDEPENDENT_AMBULATORY_CARE_PROVIDER_SITE_OTHER): Payer: Self-pay

## 2021-11-08 DIAGNOSIS — Z207 Contact with and (suspected) exposure to pediculosis, acariasis and other infestations: Secondary | ICD-10-CM

## 2021-11-08 MED ORDER — PERMETHRIN 5 % EX CREA: TOPICAL_CREAM | CUTANEOUS | 1 refills | Status: DC

## 2021-11-08 NOTE — Telephone Encounter (Signed)
Spoke to pt and provider sending in meds also pt state that she would wait until her urologist appt next week for her UTI symptoms.

## 2021-11-15 ENCOUNTER — Ambulatory Visit: Payer: 59 | Admitting: *Deleted

## 2021-11-15 ENCOUNTER — Other Ambulatory Visit: Payer: Self-pay

## 2021-11-18 ENCOUNTER — Ambulatory Visit: Payer: 59 | Admitting: Obstetrics and Gynecology

## 2021-11-22 NOTE — Progress Notes (Signed)
Erroneous encounter

## 2021-11-23 ENCOUNTER — Telehealth: Payer: Self-pay

## 2021-11-23 ENCOUNTER — Ambulatory Visit: Payer: 59 | Admitting: Obstetrics and Gynecology

## 2021-11-23 ENCOUNTER — Ambulatory Visit: Payer: Self-pay

## 2021-11-23 NOTE — Telephone Encounter (Signed)
?  Chief Complaint: scabies exposure ?Symptoms: itchy skin, no bumps present  ?Frequency: ongoing for over a year  ?Pertinent Negatives: NA ?Disposition: [] ED /[] Urgent Care (no appt availability in office) / [] Appointment(In office/virtual)/ []  [place] [facility]/ [] Home Care/ [] Refused Recommended Disposition /[x] [place] [facility]/ []  Follow-up with PCP ?Additional Notes: Pt was very tearful and states that people think she is crazy because she feels like she has scabies since she had exposure for so long and that she has gave it to her daughter and family since she is staying sick frequently. Pt is asking about getting a skin test done. She denies having any bumps or scabs on skin because she keeps skin moisturized. I asked about her using the cream that was prescribed on 11/08/21 but she states she didn't use that because she feels like if her daughter has it as well the cream wouldn't do any good and was concerned of how toxic it is. Advised pt she can go to [facility] and walk in and see [place]. Pt was provided with phone number and location.  ? ? ?Reason for Disposition ? [1] EXPOSURE (close contact) to person with known scabies AND [2] has not been treated ? ?Answer Assessment - Initial Assessment Questions ?1. PLACE OF EXPOSURE: "Where were you when you were exposed?" (e.g., home, work) ?    Work with [facility] patient ?2. TYPE OF EXPOSURE: "How were you exposed?" "How much contact was there?" ?    Worked with pt for several weeks  ?3. DATE OF EXPOSURE: "When did the exposure occur?" (e.g., days) ?    1 year or more ago  ?4. SYMPTOMS: "Do you have any symptoms?" (e.g., itching, bumpy rash)  If Yes, ask: "What does it look like?" "Which parts of your body are affected?" ?    Itchy skin bumpy ?5. PRIOR HISTORY: "Have you ever had scabies before?" ?    no ?6. CLOSE CONTACTS: "Does any person who lives with you or has had close contact with you have itching?" ?    Daughter had at one point rash but wasn't  diagnosed with scabies ? ?Protocols used: Scabies Exposure-A-AH ? ?

## 2021-11-23 NOTE — Telephone Encounter (Signed)
Called and spoke to pt

## 2021-11-23 NOTE — Telephone Encounter (Signed)
Called pt and said she has no issued like bumps or itching. She said that the medication is toxic and scared to use it. She wants to get tested for scabies. ?

## 2021-11-25 ENCOUNTER — Telehealth: Payer: Self-pay

## 2021-11-25 NOTE — Telephone Encounter (Signed)
Called pt and said she has no issued like bumps or itching. She said that the medication is toxic and scared to use it. She wants to get tested for scabies. Wants a referral to dermatology. ?

## 2022-02-12 ENCOUNTER — Other Ambulatory Visit: Payer: Self-pay | Admitting: Nurse Practitioner

## 2022-02-12 DIAGNOSIS — I10 Essential (primary) hypertension: Secondary | ICD-10-CM

## 2022-02-14 ENCOUNTER — Ambulatory Visit: Payer: Self-pay | Admitting: Physician Assistant

## 2022-02-14 ENCOUNTER — Ambulatory Visit: Payer: Self-pay | Admitting: *Deleted

## 2022-02-14 ENCOUNTER — Other Ambulatory Visit (HOSPITAL_COMMUNITY): Payer: Self-pay

## 2022-02-14 MED ORDER — LOSARTAN POTASSIUM 50 MG PO TABS
ORAL_TABLET | Freq: Every day | ORAL | 0 refills | Status: DC
  Filled 2022-02-14: qty 30, 30d supply, fill #0

## 2022-02-14 NOTE — Telephone Encounter (Signed)
  Chief Complaint: Almost out of losartan 50 mg   No appts until June with PCP.   Seeking advice what to do. Symptoms: No symptoms but last visit to [facility] was over a yr ago.   Rx request was denied as a result. Frequency: N/A Pertinent Negatives: Patient denies being completely out of losartan but "It's running low". Disposition: '[]'$ ED /'[]'$ Urgent Care (no appt availability in office) / '[]'$ Appointment(In office/virtual)/ '[]'$  [facility]/ '[]'$ Home Care/ '[]'$ Refused Recommended Disposition /'[x]'$  Mobile Bus/ '[]'$  Follow-up with PCP Additional Notes: Given location and hours for [facility] for today.  Agreeable to going there.   I made her an appt with Geryl Rankins, NP for 03/22/2022 at 10:10 for physical.

## 2022-02-14 NOTE — Progress Notes (Signed)
Patient was already given refill Will f/u with PCP

## 2022-02-14 NOTE — Telephone Encounter (Signed)
Requested medication (s) are due for refill today: yes  Requested medication (s) are on the active medication list: yes  Last refill:  08/08/21  Future visit scheduled: no  Notes to clinic:  called pt and LM on VM to call [phone] to make an appointment    Requested Prescriptions  Pending Prescriptions Disp Refills   losartan (COZAAR) 50 MG tablet 90 tablet 0    Sig: TAKE 1 TABLET (50 MG TOTAL) BY MOUTH DAILY.     Cardiovascular:  Angiotensin Receptor Blockers Failed - 02/12/2022 10:31 AM      Failed - Cr in normal range and within 180 days    Creatinine, Ser  Date Value Ref Range Status  05/04/2021 0.70 0.57 - 1.00 mg/dL Final         Failed - K in normal range and within 180 days    Potassium  Date Value Ref Range Status  05/04/2021 3.8 3.5 - 5.2 mmol/L Final         Failed - Last BP in normal range    BP Readings from Last 1 Encounters:  10/05/21 (!) 169/107         Failed - Valid encounter within last 6 months    Recent Outpatient Visits           9 months ago Right upper quadrant abdominal pain   [place], [place], [place]   1 year ago Essential hypertension   [place], Vernia Buff, NP   1 year ago Encounter to establish care   [place], Vernia Buff, NP               Passed - Patient is not pregnant

## 2022-02-14 NOTE — Telephone Encounter (Signed)
I returned her call.  C/o being out of her medication, losartan 50 mg.  It was denied because not been seen since August 2021 by PCP.  No appts until August 2023.  Seeking advice since out of medication.   Reason for Disposition  [1] Caller has URGENT medicine question about med that PCP or specialist prescribed AND [2] triager unable to answer question    Almost out of losartan.   Referred to [facility] since no appts until June 2023.   Appt made.  Answer Assessment - Initial Assessment Questions 1. NAME of MEDICATION: "What medicine are you calling about?"     Losartan 50 mg I'm running low. 2. QUESTION: "What is your question?" (e.g., double dose of medicine, side effect)     I'm almost out of losartan.   3. PRESCRIBING HCP: "Who prescribed it?" Reason: if prescribed by specialist, call should be referred to that group.     Geryl Rankins, NP 4. SYMPTOMS: "Do you have any symptoms?"      5. SEVERITY: If symptoms are present, ask "Are they mild, moderate or severe?"      6. PREGNANCY:  "Is there any chance that you are pregnant?" "When was your last menstrual period?"  Protocols used: Medication Question Call-A-AH

## 2022-02-27 ENCOUNTER — Telehealth: Payer: Self-pay | Admitting: Physical Medicine and Rehabilitation

## 2022-02-27 NOTE — Telephone Encounter (Signed)
Pt called and was wondering if she can schedule another back injection. Last injection was in 2021.   CB [phone] [id number]

## 2022-02-28 NOTE — Telephone Encounter (Signed)
Called pt to get sch for OV. LVM#1  

## 2022-03-01 ENCOUNTER — Encounter: Payer: Self-pay | Admitting: Physical Medicine and Rehabilitation

## 2022-03-01 ENCOUNTER — Ambulatory Visit (INDEPENDENT_AMBULATORY_CARE_PROVIDER_SITE_OTHER): Payer: 59 | Admitting: Physical Medicine and Rehabilitation

## 2022-03-01 VITALS — BP 166/101 | HR 76

## 2022-03-01 DIAGNOSIS — M4726 Other spondylosis with radiculopathy, lumbar region: Secondary | ICD-10-CM

## 2022-03-01 DIAGNOSIS — M7918 Myalgia, other site: Secondary | ICD-10-CM | POA: Diagnosis not present

## 2022-03-01 DIAGNOSIS — M797 Fibromyalgia: Secondary | ICD-10-CM

## 2022-03-01 DIAGNOSIS — M5416 Radiculopathy, lumbar region: Secondary | ICD-10-CM

## 2022-03-01 DIAGNOSIS — M5116 Intervertebral disc disorders with radiculopathy, lumbar region: Secondary | ICD-10-CM | POA: Diagnosis not present

## 2022-03-01 NOTE — Progress Notes (Signed)
Pt state lower back pain that travels right leg. Pt state walking and standing makes the pain worse. Pt state she has been losing her balance. Pt state neck pain that travels down both shoulders and down mid back. Pt state movement makes the pain worse. Pt state she takes over the counter pain meds and uses heat to help ease her pain.  Numeric Pain Rating Scale and Functional Assessment Average Pain 10 Pain Right Now 6 My pain is intermittent, sharp, burning, stabbing, tingling, and aching Pain is worse with: walking, standing, and some activites Pain improves with: heat/ice, therapy/exercise, medication, and injections   In the last MONTH (on 0-10 scale) has pain interfered with the following?  1. General activity like being  able to carry out your everyday physical activities such as walking, climbing stairs, carrying groceries, or moving a chair?  Rating(5)  2. Relation with others like being able to carry out your usual social activities and roles such as  activities at home, at work and in your community. Rating(6)  3. Enjoyment of life such that you have  been bothered by emotional problems such as feeling anxious, depressed or irritable?  Rating(7)
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
## 2022-03-02 ENCOUNTER — Telehealth: Payer: Self-pay | Admitting: Physical Medicine and Rehabilitation

## 2022-03-02 NOTE — Progress Notes (Signed)
**  ADDENDUM    After reading our dictated note, patient called in today requesting changes to history of present illness. Our notes are not intended to be all inclusive to every aspect of patients history, however at the patient request she does note the following: patient requesting that anxiety be removed from review of systems, she would also like to add that she underwent chiropractic care in [place] with Dr. Arvella Nigh at [place]. Patient would also like to add that she does not use heating pad as a therapeutic modality, however she walks frequently which would constitute as an exercise regimen.   In the future if the patient would like a more detailed history we invite her to provide [place] with her own written documentation etc

## 2022-03-02 NOTE — Telephone Encounter (Signed)
Pt would like Megan to call her

## 2022-03-22 ENCOUNTER — Other Ambulatory Visit: Payer: Self-pay | Admitting: Nurse Practitioner

## 2022-03-22 ENCOUNTER — Encounter: Payer: Self-pay | Admitting: Nurse Practitioner

## 2022-03-22 ENCOUNTER — Encounter: Payer: Self-pay | Admitting: Physical Medicine and Rehabilitation

## 2022-03-22 ENCOUNTER — Ambulatory Visit: Payer: 59 | Attending: Nurse Practitioner | Admitting: Nurse Practitioner

## 2022-03-22 ENCOUNTER — Ambulatory Visit: Payer: Self-pay

## 2022-03-22 ENCOUNTER — Ambulatory Visit: Payer: 59 | Admitting: Physical Medicine and Rehabilitation

## 2022-03-22 ENCOUNTER — Other Ambulatory Visit: Payer: Self-pay

## 2022-03-22 ENCOUNTER — Other Ambulatory Visit (HOSPITAL_COMMUNITY)
Admission: RE | Admit: 2022-03-22 | Discharge: 2022-03-22 | Disposition: A | Discharge status: Home / Self Care | Payer: 59 | Source: Ambulatory Visit | Attending: Nurse Practitioner | Admitting: Nurse Practitioner

## 2022-03-22 VITALS — BP 145/99 | HR 70 | Temp 98.4°F | Resp 16 | Ht 67.0 in | Wt 186.0 lb

## 2022-03-22 VITALS — BP 134/88 | HR 82

## 2022-03-22 DIAGNOSIS — M5416 Radiculopathy, lumbar region: Secondary | ICD-10-CM

## 2022-03-22 DIAGNOSIS — Z1231 Encounter for screening mammogram for malignant neoplasm of breast: Secondary | ICD-10-CM

## 2022-03-22 DIAGNOSIS — M797 Fibromyalgia: Secondary | ICD-10-CM | POA: Diagnosis not present

## 2022-03-22 DIAGNOSIS — Z0000 Encounter for general adult medical examination without abnormal findings: Secondary | ICD-10-CM

## 2022-03-22 DIAGNOSIS — Z0001 Encounter for general adult medical examination with abnormal findings: Secondary | ICD-10-CM | POA: Diagnosis not present

## 2022-03-22 DIAGNOSIS — K0889 Other specified disorders of teeth and supporting structures: Secondary | ICD-10-CM

## 2022-03-22 DIAGNOSIS — E559 Vitamin D deficiency, unspecified: Secondary | ICD-10-CM

## 2022-03-22 DIAGNOSIS — N760 Acute vaginitis: Secondary | ICD-10-CM | POA: Diagnosis not present

## 2022-03-22 DIAGNOSIS — I10 Essential (primary) hypertension: Secondary | ICD-10-CM | POA: Diagnosis not present

## 2022-03-22 DIAGNOSIS — D500 Iron deficiency anemia secondary to blood loss (chronic): Secondary | ICD-10-CM

## 2022-03-22 DIAGNOSIS — E785 Hyperlipidemia, unspecified: Secondary | ICD-10-CM

## 2022-03-22 DIAGNOSIS — Z131 Encounter for screening for diabetes mellitus: Secondary | ICD-10-CM

## 2022-03-22 DIAGNOSIS — Z114 Encounter for screening for human immunodeficiency virus [HIV]: Secondary | ICD-10-CM

## 2022-03-22 LAB — POCT URINALYSIS DIP (CLINITEK)
Bilirubin, UA: NEGATIVE
Blood, UA: NEGATIVE
Glucose, UA: NEGATIVE mg/dL
Ketones, POC UA: NEGATIVE mg/dL
Leukocytes, UA: NEGATIVE
Nitrite, UA: NEGATIVE
POC PROTEIN,UA: NEGATIVE
Spec Grav, UA: 1.015 (ref 1.010–1.025)
Urobilinogen, UA: 0.2 E.U./dL
pH, UA: 6 (ref 5.0–8.0)

## 2022-03-22 MED ORDER — IBUPROFEN 600 MG PO TABS
600.0000 mg | ORAL_TABLET | Freq: Three times a day (TID) | ORAL | 0 refills | Status: DC | PRN
  Filled 2022-03-22: qty 60, 20d supply, fill #0

## 2022-03-22 MED ORDER — METHYLPREDNISOLONE ACETATE 80 MG/ML IJ SUSP: 80.0000 mg | Freq: Once | INTRAMUSCULAR | Status: DC

## 2022-03-22 MED ORDER — LOSARTAN POTASSIUM 100 MG PO TABS
100.0000 mg | ORAL_TABLET | Freq: Every day | ORAL | 1 refills | Status: DC
  Filled 2022-03-22 – 2022-06-15 (×2): qty 90, 90d supply, fill #0

## 2022-03-22 NOTE — Progress Notes (Signed)
Assessment & Plan:  Archer was seen today for annual exam and vaginal discharge.  Diagnoses and all orders for this visit:  Encounter for annual physical exam  Acute vaginitis -     POCT URINALYSIS DIP (CLINITEK) -     Cervicovaginal ancillary only  Fibromyalgia -     Ambulatory referral to Pain Clinic  Essential hypertension -     CMP14+EGFR -     losartan (COZAAR) 100 MG tablet; Take 1 tablet (100 mg total) by mouth daily.  Encounter for screening for HIV -     HIV antibody (with reflex)  Dyslipidemia, goal LDL below 100 -     Lipid panel  Iron deficiency anemia due to chronic blood loss -     CBC with Differential -     Iron, TIBC and Ferritin Panel  Vitamin D deficiency disease -     VITAMIN D 25 Hydroxy (Vit-D Deficiency, Fractures)  Encounter for screening for diabetes mellitus -     Hemoglobin A1c  Breast cancer screening by mammogram -     MM DIAG BREAST TOMO BILATERAL; Future    Patient has been counseled on age-appropriate routine health concerns for screening and prevention. These are reviewed and up-to-date. Referrals have been placed accordingly. Immunizations are up-to-date or declined.    Subjective:   Chief Complaint  Patient presents with   Annual Exam   Vaginal Discharge   HPI Catherine Garza 46 y.o. female presents to office today for annual physical  She is currently being followed by Ortho for back pain and due to concerns of chronic generalized pain despite consevative measures it was recommended for her to follow up with me for possible fibromyalgia. Per ortho "she does have multiple areas of tenderness upon palpation to middle/upper back paraspinal regions. I also feel there could be a type of central sensitization syndrome such as fibromyalgia contributing to her pain. I did have patient complete widespread pain index today, she does meet diagnostic criteria for fibromyalgia"  As I do not treat fibromyalgia she will be referred to pain  management today.   HTN She is currently taking losartan 50 mg daily. States she can feel her blood pressure is high at home however she does not have a working blood pressure monitoring device. Based on office readings I have increased her losartan to 100 mg today.  BP Readings from Last 3 Encounters:  03/22/22 134/88  03/22/22 (!) 145/99  03/01/22 (!) 166/101     Vaginitis Notes irritation/discomfort in the vaginal area. States her daughter said patient also has a vaginal odor. Patient has decreased sense of smell so she is unable to confirm an y malodorous discharge.  UA negative today in office.      Review of Systems  Constitutional:  Negative for fever, malaise/fatigue and weight loss.  HENT: Negative.  Negative for nosebleeds.   Eyes: Negative.  Negative for blurred vision, double vision and photophobia.  Respiratory: Negative.  Negative for cough and shortness of breath.   Cardiovascular: Negative.  Negative for chest pain, palpitations and leg swelling.  Gastrointestinal: Negative.  Negative for heartburn, nausea and vomiting.  Genitourinary:        SEE HPI  Musculoskeletal:  Positive for back pain and myalgias.  Skin: Negative.   Neurological: Negative.  Negative for dizziness, focal weakness, seizures and headaches.  Endo/Heme/Allergies: Negative.   Psychiatric/Behavioral:  Positive for depression. Negative for suicidal ideas.     Past Medical History:  Diagnosis Date  Depression    Hypertension     Past Surgical History:  Procedure Laterality Date   leep      Family History  Problem Relation Age of Onset   Hypertension Mother    Hypertension Father    Ovarian cancer Maternal Grandmother    Throat cancer Paternal Grandfather        smoker     Social History Reviewed with no changes to be made today.   Outpatient Medications Prior to Visit  Medication Sig Dispense Refill   ergocalciferol (VITAMIN D2) 1.25 MG (50000 UT) capsule      ferrous sulfate  325 (65 FE) MG tablet Take 1 tablet (325 mg total) by mouth daily with breakfast. 90 tablet 1   Multiple Vitamins-Minerals (WOMENS MULTI GUMMIES PO) Take by mouth.     losartan (COZAAR) 50 MG tablet TAKE 1 TABLET BY MOUTH DAILY. 30 tablet 0   doxycycline (DORYX) 100 MG EC tablet Take 100 mg by mouth 2 (two) times daily. (Patient not taking: Reported on 03/22/2022)     permethrin (ELIMITE) 5 % cream Apply to all areas of the body from the neck to soles of feet leave on for 8 to 14 hours before removing by washing (shower or bath). One application is generally curative; may repeat if living mites are observed 14 days after first treatment (Patient not taking: Reported on 03/22/2022) 60 g 1   No facility-administered medications prior to visit.    Allergies  Allergen Reactions   Prednisone Other (See Comments)    Caused her to have blood in her stool   Latex Hives   Penicillins Hives   Nitrofurantoin Other (See Comments)    Severe pain       Objective:    BP (!) 145/99 (BP Location: Left Arm, Patient Position: Sitting, Cuff Size: Large)   Pulse 70   Temp 98.4 F (36.9 C) (Oral)   Resp 16   Ht '5\' 7"'  (1.702 m)   Wt 186 lb (84.4 kg)   LMP 03/17/2022 (Exact Date)   SpO2 99%   BMI 29.13 kg/m  Wt Readings from Last 3 Encounters:  03/22/22 186 lb (84.4 kg)  10/05/21 191 lb (86.6 kg)  05/04/21 190 lb 9.6 oz (86.5 kg)    Physical Exam Vitals and nursing note reviewed.  Constitutional:      Appearance: She is well-developed.  HENT:     Head: Normocephalic and atraumatic.     Right Ear: Hearing, tympanic membrane, ear canal and external ear normal.     Left Ear: Hearing, tympanic membrane, ear canal and external ear normal.     Nose: Nose normal.     Right Turbinates: Not enlarged.     Left Turbinates: Not enlarged.     Mouth/Throat:     Lips: Pink.     Mouth: Mucous membranes are moist.     Dentition: No dental tenderness, gingival swelling, dental abscesses or gum lesions.      Pharynx: No oropharyngeal exudate.  Eyes:     General: No scleral icterus.       Right eye: No discharge.     Extraocular Movements: Extraocular movements intact.     Conjunctiva/sclera: Conjunctivae normal.     Pupils: Pupils are equal, round, and reactive to light.  Neck:     Thyroid: No thyromegaly.     Trachea: No tracheal deviation.  Cardiovascular:     Rate and Rhythm: Normal rate and regular rhythm.     Heart  sounds: Normal heart sounds. No murmur heard.    No friction rub. No gallop.  Pulmonary:     Effort: Pulmonary effort is normal. No tachypnea, accessory muscle usage or respiratory distress.     Breath sounds: Normal breath sounds. No decreased breath sounds, wheezing, rhonchi or rales.  Chest:     Chest wall: No tenderness.  Abdominal:     General: Bowel sounds are normal. There is no distension.     Palpations: Abdomen is soft. There is no mass.     Tenderness: There is no abdominal tenderness. There is no right CVA tenderness, left CVA tenderness, guarding or rebound.     Hernia: No hernia is present.  Musculoskeletal:        General: No tenderness or deformity. Normal range of motion.     Cervical back: Normal range of motion and neck supple.  Lymphadenopathy:     Cervical: No cervical adenopathy.  Skin:    General: Skin is warm and dry.     Findings: No erythema.  Neurological:     Mental Status: She is alert and oriented to person, place, and time.     Cranial Nerves: No cranial nerve deficit.     Motor: Motor function is intact.     Coordination: Coordination is intact. Coordination normal.     Gait: Gait is intact.     Deep Tendon Reflexes:     Reflex Scores:      Patellar reflexes are 1+ on the right side and 1+ on the left side. Psychiatric:        Attention and Perception: Attention normal.        Mood and Affect: Mood normal.        Speech: Speech normal.        Behavior: Behavior normal. Behavior is cooperative.        Thought Content: Thought  content normal.        Judgment: Judgment normal.          Patient has been counseled extensively about nutrition and exercise as well as the importance of adherence with medications and regular follow-up. The patient was given clear instructions to go to ER or return to medical center if symptoms don't improve, worsen or new problems develop. The patient verbalized understanding.   Follow-up: Return for virtual visit with me on a tuesday in 2 weeks BP Check.   Gildardo Pounds, FNP-BC [facility] [place] [facility] [place], [place]   03/22/2022, 1:38 PM

## 2022-03-22 NOTE — Progress Notes (Signed)
Pt state lower back pain that travels down her right leg. Pt state walking and standing makes the pain worse. Pt state she takes over the counter pain meds and uses heat to help ease her pain.  Numeric Pain Rating Scale and Functional Assessment Average Pain 7   In the last MONTH (on 0-10 scale) has pain interfered with the following?  1. General activity like being  able to carry out your everyday physical activities such as walking, climbing stairs, carrying groceries, or moving a chair?  Rating(10)   +Driver, -BT, -Dye Allergies.

## 2022-03-22 NOTE — Patient Instructions (Signed)

## 2022-03-22 NOTE — Progress Notes (Signed)
Felt some discomfort in vagina couples day ago. Denies dysuria or odor. Has vaginal discharge. Request UA and checking

## 2022-03-23 LAB — IRON,TIBC AND FERRITIN PANEL
Ferritin: 74 ng/mL (ref 15–150)
Iron Saturation: 15 % (ref 15–55)
Iron: 48 ug/dL (ref 27–159)
Total Iron Binding Capacity: 310 ug/dL (ref 250–450)
UIBC: 262 ug/dL (ref 131–425)

## 2022-03-23 LAB — CERVICOVAGINAL ANCILLARY ONLY
Bacterial Vaginitis (gardnerella): NEGATIVE
Candida Glabrata: NEGATIVE
Candida Vaginitis: NEGATIVE
Chlamydia: NEGATIVE
Comment: NEGATIVE
Comment: NEGATIVE
Comment: NEGATIVE
Comment: NEGATIVE
Comment: NEGATIVE
Comment: NORMAL
Neisseria Gonorrhea: NEGATIVE
Trichomonas: NEGATIVE

## 2022-03-23 LAB — CMP14+EGFR
ALT: 15 IU/L (ref 0–32)
AST: 13 IU/L (ref 0–40)
Albumin/Globulin Ratio: 1.8 (ref 1.2–2.2)
Albumin: 4.6 g/dL (ref 3.8–4.8)
Alkaline Phosphatase: 97 IU/L (ref 44–121)
BUN/Creatinine Ratio: 11 (ref 9–23)
BUN: 8 mg/dL (ref 6–24)
Bilirubin Total: 0.2 mg/dL (ref 0.0–1.2)
CO2: 24 mmol/L (ref 20–29)
Calcium: 9.1 mg/dL (ref 8.7–10.2)
Chloride: 102 mmol/L (ref 96–106)
Creatinine, Ser: 0.74 mg/dL (ref 0.57–1.00)
Globulin, Total: 2.6 g/dL (ref 1.5–4.5)
Glucose: 89 mg/dL (ref 70–99)
Potassium: 4 mmol/L (ref 3.5–5.2)
Sodium: 139 mmol/L (ref 134–144)
Total Protein: 7.2 g/dL (ref 6.0–8.5)
eGFR: 102 mL/min/{1.73_m2} (ref >59)

## 2022-03-23 LAB — CBC WITH DIFFERENTIAL/PLATELET
Basophils Absolute: 0.1 10*3/uL (ref 0.0–0.2)
Basos: 1 %
EOS (ABSOLUTE): 0.1 10*3/uL (ref 0.0–0.4)
Eos: 1 %
Hematocrit: 38.8 % (ref 34.0–46.6)
Hemoglobin: 13.4 g/dL (ref 11.1–15.9)
Immature Grans (Abs): 0 10*3/uL (ref 0.0–0.1)
Immature Granulocytes: 0 %
Lymphocytes Absolute: 3 10*3/uL (ref 0.7–3.1)
Lymphs: 34 %
MCH: 29.1 pg (ref 26.6–33.0)
MCHC: 34.5 g/dL (ref 31.5–35.7)
MCV: 84 fL (ref 79–97)
Monocytes Absolute: 0.4 10*3/uL (ref 0.1–0.9)
Monocytes: 4 %
Neutrophils Absolute: 5.3 10*3/uL (ref 1.4–7.0)
Neutrophils: 60 %
Platelets: 304 10*3/uL (ref 150–450)
RBC: 4.6 x10E6/uL (ref 3.77–5.28)
RDW: 13.1 % (ref 11.7–15.4)
WBC: 8.9 10*3/uL (ref 3.4–10.8)

## 2022-03-23 LAB — VITAMIN D 25 HYDROXY (VIT D DEFICIENCY, FRACTURES): Vit D, 25-Hydroxy: 37.4 ng/mL (ref 30.0–100.0)

## 2022-03-23 LAB — HEMOGLOBIN A1C
Est. average glucose Bld gHb Est-mCnc: 108 mg/dL
Hgb A1c MFr Bld: 5.4 % (ref 4.8–5.6)

## 2022-03-23 LAB — LIPID PANEL
Chol/HDL Ratio: 3.8 ratio (ref 0.0–4.4)
Cholesterol, Total: 214 mg/dL — ABNORMAL HIGH (ref 100–199)
HDL: 56 mg/dL (ref >39)
LDL Chol Calc (NIH): 148 mg/dL — ABNORMAL HIGH (ref 0–99)
Triglycerides: 58 mg/dL (ref 0–149)
VLDL Cholesterol Cal: 10 mg/dL (ref 5–40)

## 2022-03-23 LAB — HIV ANTIBODY (ROUTINE TESTING W REFLEX): HIV Screen 4th Generation wRfx: NONREACTIVE

## 2022-04-04 ENCOUNTER — Encounter: Payer: Self-pay | Admitting: Physical Medicine & Rehabilitation

## 2022-04-04 ENCOUNTER — Ambulatory Visit: Payer: 59 | Attending: Nurse Practitioner | Admitting: Nurse Practitioner

## 2022-04-04 ENCOUNTER — Encounter: Payer: Self-pay | Admitting: Nurse Practitioner

## 2022-04-04 DIAGNOSIS — I10 Essential (primary) hypertension: Secondary | ICD-10-CM | POA: Diagnosis not present

## 2022-04-04 DIAGNOSIS — E785 Hyperlipidemia, unspecified: Secondary | ICD-10-CM | POA: Diagnosis not present

## 2022-04-04 NOTE — Progress Notes (Signed)
Virtual Visit via Telephone Note  I discussed the limitations, risks, security and privacy concerns of performing an evaluation and management service by telephone and the availability of in person appointments. I also discussed with the patient that there may be a patient responsible charge related to this service. The patient expressed understanding and agreed to proceed.    I connected with Annye English on 04/04/22  at   3:30 PM EDT  EDT by telephone and verified that I am speaking with the correct person using two identifiers.  Location of Patient: Private Residence   Location of Provider: [facility] [employer] Office    Persons participating in Telemedicine visit: Geryl Rankins FNP-BC Annye English    History of Present Illness: Telemedicine visit for: HTN She has a past medical history of Depression and Hypertension.   Her losartan was increased to 100 mg a few weeks ago. She was instructed at that time to obtain a blood pressure device for her arm (no wrist cuff) Today we reviewed her home readings and current average 127/89. 126/91 135/91 132/95 113/83 131/89 126/84 123/91 130/94 130/89  I have encouraged her to monitor her sodium intake and avoid fried foods, caffeine and seasonings that may have sodium contents as well.   States the side of her lip feels like she received novacaine. We discussed symptoms of stroke today.   We discussed her most recent lipid panel. Dietary modifications were discussed as well today. No statin prescribed for now.  The 10-year ASCVD risk score (Arnett DK, et al., 2019) is: 2.7%   Values used to calculate the score:     Age: 46 years     Sex: Female     Is Non-Hispanic African American: Yes     Diabetic: No     Tobacco smoker: No     Systolic Blood Pressure: 833 mmHg     Is BP treated: Yes     HDL Cholesterol: 56 mg/dL     Total Cholesterol: 214 mg/dL       Past Medical History:  Diagnosis Date   Depression     Hypertension     Past Surgical History:  Procedure Laterality Date   leep      Family History  Problem Relation Age of Onset   Hypertension Mother    Hypertension Father    Ovarian cancer Maternal Grandmother    Throat cancer Paternal Grandfather        smoker     Social History   Socioeconomic History   Marital status: Married    Spouse name: Not on file   Number of children: Not on file   Years of education: Not on file   Highest education level: Not on file  Occupational History   Not on file  Tobacco Use   Smoking status: Never   Smokeless tobacco: Never  Vaping Use   Vaping Use: Never used  Substance and Sexual Activity   Alcohol use: Never   Drug use: Never   Sexual activity: Yes    Birth control/protection: None  Other Topics Concern   Not on file  Social History Narrative   Not on file   Social Determinants of Health   Financial Resource Strain: Not on file  Food Insecurity: Not on file  Transportation Needs: Not on file  Physical Activity: Not on file  Stress: Not on file  Social Connections: Not on file     Observations/Objective: Awake, alert and oriented x 3   Review  of Systems  Constitutional:  Negative for fever, malaise/fatigue and weight loss.  HENT: Negative.  Negative for nosebleeds.   Eyes: Negative.  Negative for blurred vision, double vision and photophobia.  Respiratory: Negative.  Negative for cough and shortness of breath.   Cardiovascular: Negative.  Negative for chest pain, palpitations and leg swelling.  Gastrointestinal: Negative.  Negative for heartburn, nausea and vomiting.  Musculoskeletal: Negative.  Negative for myalgias.  Neurological: Negative.  Negative for dizziness, focal weakness, seizures and headaches.  Psychiatric/Behavioral: Negative.  Negative for suicidal ideas.     Assessment and Plan: Diagnoses and all orders for this visit:  Essential hypertension Continue all antihypertensives as prescribed.   Reminded to bring in blood pressure log for follow  up appointment.  RECOMMENDATIONS: DASH/Mediterranean Diets are healthier choices for HTN.    Dyslipidemia, goal LDL below 100 INSTRUCTIONS: Work on a low fat, heart healthy diet and participate in regular aerobic exercise program by working out at least 150 minutes per week; 5 days a week-30-45 minutes per day. Avoid red meat/beef/steak,  fried foods. junk foods, sodas, sugary drinks, unhealthy snacking, alcohol and smoking.  Drink at least 80 oz of water per day and monitor your carbohydrate intake daily.       Follow Up Instructions Return in about 3 months (around 06/22/2022).     I discussed the assessment and treatment plan with the patient. The patient was provided an opportunity to ask questions and all were answered. The patient agreed with the plan and demonstrated an understanding of the instructions.   The patient was advised to call back or seek an in-person evaluation if the symptoms worsen or if the condition fails to improve as anticipated.  I provided 13 minutes of non-face-to-face time during this encounter including median intraservice time, reviewing previous notes, labs, imaging, medications and explaining diagnosis and management.  Gildardo Pounds, FNP-BC

## 2022-04-10 ENCOUNTER — Encounter: Payer: 59 | Admitting: Physical Medicine & Rehabilitation

## 2022-04-17 NOTE — Progress Notes (Signed)
Catherine Garza - 46 y.o. female MRN [id number]  Date of birth: Jan 21, 1976  Office Visit Note: Visit Date: 03/22/2022 PCP: Gildardo Pounds, NP Referred by: Gildardo Pounds, NP  Subjective: Chief Complaint  Patient presents with   Right Leg - Pain   Lower Back - Pain   HPI:  Catherine Garza is a 46 y.o. female who comes in today at the request of Barnet Pall, FNP for planned Right L5-S1 Lumbar Interlaminar epidural steroid injection with fluoroscopic guidance.  The patient has failed conservative care including home exercise, medications, time and activity modification.  This injection will be diagnostic and hopefully therapeutic.  Please see requesting physician notes for further details and justification.   ROS Otherwise per HPI.  Assessment & Plan: Visit Diagnoses:    ICD-10-CM   1. Lumbar radiculopathy  M54.16 XR C-ARM NO REPORT    Epidural Steroid injection    DISCONTINUED: methylPREDNISolone acetate (DEPO-MEDROL) injection 80 mg      Plan: No additional findings.   Meds & Orders:  Meds ordered this encounter  Medications   DISCONTD: methylPREDNISolone acetate (DEPO-MEDROL) injection 80 mg    Orders Placed This Encounter  Procedures   XR C-ARM NO REPORT   Epidural Steroid injection    Follow-up: Return if symptoms worsen or fail to improve.   Procedures: No procedures performed  Lumbar Epidural Steroid Injection - Interlaminar Approach with Fluoroscopic Guidance  Patient: Catherine Garza      Date of Birth: 06-Mar-1976 MRN: [id number] PCP: Gildardo Pounds, NP      Visit Date: 03/22/2022   Universal Protocol:     Consent Given By: the patient  Position: PRONE  Additional Comments: Vital signs were monitored before and after the procedure. Patient was prepped and draped in the usual sterile fashion. The correct patient, procedure, and site was verified.   Injection Procedure Details:   Procedure diagnoses: Lumbar radiculopathy  [M54.16]   Meds Administered:  Meds ordered this encounter  Medications   DISCONTD: methylPREDNISolone acetate (DEPO-MEDROL) injection 80 mg     Laterality: Right  Location/Site:  L5-S1  Needle: 3.5 in., 20 ga. Tuohy  Needle Placement: Paramedian epidural  Findings:   -Comments: Excellent flow of contrast into the epidural space.  Procedure Details: Using a paramedian approach from the side mentioned above, the region overlying the inferior lamina was localized under fluoroscopic visualization and the soft tissues overlying this structure were infiltrated with 4 ml. of 1% Lidocaine without Epinephrine. The Tuohy needle was inserted into the epidural space using a paramedian approach.   The epidural space was localized using loss of resistance along with counter oblique bi-planar fluoroscopic views.  After negative aspirate for air, blood, and CSF, a 2 ml. volume of Isovue-250 was injected into the epidural space and the flow of contrast was observed. Radiographs were obtained for documentation purposes.    The injectate was administered into the level noted above.   Additional Comments:  The patient tolerated the procedure well Dressing: 2 x 2 sterile gauze and Band-Aid    Post-procedure details: Patient was observed during the procedure. Post-procedure instructions were reviewed.  Patient left the clinic in stable condition.   Clinical History: Patient name: [facility]  Referring physician: Osker Mason  Radiologist: Deedra Ehrich, MD  Date of service: 04/10/2019  Exam requested: MRI SPINE LUMBAR  SPINE W/O CONTRAST   Findings:  T12-L1: No disc herniation, central canal, or foraminal stenosis.  L1-2: No disc herniation, central  canal, or foraminal stenosis.  L2-3: No disc herniation, central canal, or foraminal stenosis.  L3-4: No disc herniation, central canal, or foraminal stenosis.  L4-5: No disc herniation, central canal, or foraminal stenosis.   L5-S1: There is a central protrusion-type disc herniation impinging the ventral thecal sac and contacting the traversing S1 nerve roots (axial 23, sagittal 6 ).  No significant disc height loss is present.  On the STIR images there is an annular tear posteriorly (sagittal STIR image 6).  No significant foraminal stenosis.  No osteophyte formation or degenerative changes.   IMPRESSION:  1. 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  Within a reasonable degree of certainty, the disc herniation appears t traumatic in etiology and associated with the date of injury 02/03/2019 with clinical correlation is recommended     Objective:  VS:  HT:    WT:   BMI:     BP:134/88  HR:82bpm  TEMP: ( )  RESP:  Physical Exam Vitals and nursing note reviewed.  Constitutional:      General: She is not in acute distress.    Appearance: Normal appearance. She is not ill-appearing.  HENT:     Head: Normocephalic and atraumatic.     Right Ear: External ear normal.     Left Ear: External ear normal.  Eyes:     Extraocular Movements: Extraocular movements intact.  Cardiovascular:     Rate and Rhythm: Normal rate.     Pulses: Normal pulses.  Pulmonary:     Effort: Pulmonary effort is normal. No respiratory distress.  Abdominal:     General: There is no distension.     Palpations: Abdomen is soft.  Musculoskeletal:        General: Tenderness present.     Cervical back: Neck supple.     Right lower leg: No edema.     Left lower leg: No edema.     Comments: Patient has good distal strength with no pain over the greater trochanters.  No clonus or focal weakness.  Skin:    Findings: No erythema, lesion or rash.  Neurological:     General: No focal deficit present.     Mental Status: She is alert and oriented to person, place, and time.     Sensory: No sensory deficit.     Motor: No weakness or  abnormal muscle tone.     Coordination: Coordination normal.  Psychiatric:        Mood and Affect: Mood normal.        Behavior: Behavior normal.      Imaging: No results found.

## 2022-04-17 NOTE — Procedures (Signed)
Lumbar Epidural Steroid Injection - Interlaminar Approach with Fluoroscopic Guidance  Patient: Catherine Garza      Date of Birth: [birth year] MRN: [id number] PCP: Gildardo Pounds, NP      Visit Date: 03/22/2022   Universal Protocol:     Consent Given By: the patient  Position: PRONE  Additional Comments: Vital signs were monitored before and after the procedure. Patient was prepped and draped in the usual sterile fashion. The correct patient, procedure, and site was verified.   Injection Procedure Details:   Procedure diagnoses: Lumbar radiculopathy [M54.16]   Meds Administered:  Meds ordered this encounter  Medications   DISCONTD: methylPREDNISolone acetate (DEPO-MEDROL) injection 80 mg     Laterality: Right  Location/Site:  L5-S1  Needle: 3.5 in., 20 ga. Tuohy  Needle Placement: Paramedian epidural  Findings:   -Comments: Excellent flow of contrast into the epidural space.  Procedure Details: Using a paramedian approach from the side mentioned above, the region overlying the inferior lamina was localized under fluoroscopic visualization and the soft tissues overlying this structure were infiltrated with 4 ml. of 1% Lidocaine without Epinephrine. The Tuohy needle was inserted into the epidural space using a paramedian approach.   The epidural space was localized using loss of resistance along with counter oblique bi-planar fluoroscopic views.  After negative aspirate for air, blood, and CSF, a 2 ml. volume of Isovue-250 was injected into the epidural space and the flow of contrast was observed. Radiographs were obtained for documentation purposes.    The injectate was administered into the level noted above.   Additional Comments:  The patient tolerated the procedure well Dressing: 2 x 2 sterile gauze and Band-Aid    Post-procedure details: Patient was observed during the procedure. Post-procedure instructions were reviewed.  Patient left the clinic in  stable condition.

## 2022-04-19 ENCOUNTER — Ambulatory Visit: Payer: 59 | Admitting: Physical Medicine and Rehabilitation

## 2022-04-19 ENCOUNTER — Encounter: Payer: Self-pay | Admitting: Physical Medicine and Rehabilitation

## 2022-04-19 VITALS — BP 132/84 | HR 80

## 2022-04-19 DIAGNOSIS — M5116 Intervertebral disc disorders with radiculopathy, lumbar region: Secondary | ICD-10-CM | POA: Diagnosis not present

## 2022-04-19 DIAGNOSIS — M4726 Other spondylosis with radiculopathy, lumbar region: Secondary | ICD-10-CM | POA: Diagnosis not present

## 2022-04-19 DIAGNOSIS — M7918 Myalgia, other site: Secondary | ICD-10-CM

## 2022-04-19 DIAGNOSIS — M5416 Radiculopathy, lumbar region: Secondary | ICD-10-CM

## 2022-04-19 DIAGNOSIS — M797 Fibromyalgia: Secondary | ICD-10-CM
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
## 2022-04-19 NOTE — Progress Notes (Signed)
Pt has hx of inj on 03/22/22 pt state she can't tell if the injection helped. Pt state her is on and she still feels stiffness in her back.  Numeric Pain Rating Scale and Functional Assessment Average Pain 10 Pain Right Now 6 My pain is constant, sharp, dull, and aching Pain is worse with: walking, bending, sitting, standing, some activites, and Laying down Pain improves with: heat/ice, medication, and injections   In the last MONTH (on 0-10 scale) has pain interfered with the following?  1. General activity like being  able to carry out your everyday physical activities such as walking, climbing stairs, carrying groceries, or moving a chair?  Rating(5)  2. Relation with others like being able to carry out your usual social activities and roles such as  activities at home, at work and in your community. Rating(6)  3. Enjoyment of life such that you have  been bothered by emotional problems such as feeling anxious, depressed or irritable?  Rating(7)

## 2022-05-19 ENCOUNTER — Encounter: Payer: Self-pay | Admitting: Physical Medicine & Rehabilitation

## 2022-05-19 ENCOUNTER — Encounter
Payer: Commercial Managed Care - HMO | Attending: Physical Medicine & Rehabilitation | Admitting: Physical Medicine & Rehabilitation

## 2022-05-19 ENCOUNTER — Other Ambulatory Visit: Payer: Self-pay

## 2022-05-19 VITALS — BP 167/111 | HR 71 | Ht 67.0 in | Wt 187.6 lb

## 2022-05-19 DIAGNOSIS — M797 Fibromyalgia: Secondary | ICD-10-CM | POA: Insufficient documentation

## 2022-05-19 DIAGNOSIS — F32A Depression, unspecified: Secondary | ICD-10-CM | POA: Insufficient documentation

## 2022-05-19 DIAGNOSIS — G8929 Other chronic pain: Secondary | ICD-10-CM | POA: Insufficient documentation

## 2022-05-19 DIAGNOSIS — M5441 Lumbago with sciatica, right side: Secondary | ICD-10-CM | POA: Diagnosis present

## 2022-05-19 MED ORDER — PREGABALIN 75 MG PO CAPS
75.0000 mg | ORAL_CAPSULE | Freq: Two times a day (BID) | ORAL | 2 refills | Status: DC
  Filled 2022-05-19 – 2022-06-15 (×3): qty 60, 30d supply, fill #0
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
## 2022-05-21 ENCOUNTER — Encounter: Payer: Self-pay | Admitting: Physical Medicine & Rehabilitation

## 2022-05-22 ENCOUNTER — Other Ambulatory Visit: Payer: Self-pay

## 2022-06-15 ENCOUNTER — Other Ambulatory Visit (HOSPITAL_COMMUNITY): Payer: Self-pay

## 2022-06-21 ENCOUNTER — Ambulatory Visit: Payer: 59 | Admitting: Nurse Practitioner

## 2022-06-29 ENCOUNTER — Encounter
Payer: Commercial Managed Care - HMO | Attending: Physical Medicine & Rehabilitation | Admitting: Physical Medicine & Rehabilitation

## 2022-06-29 DIAGNOSIS — M5441 Lumbago with sciatica, right side: Secondary | ICD-10-CM | POA: Insufficient documentation

## 2022-06-29 DIAGNOSIS — F32A Depression, unspecified: Secondary | ICD-10-CM | POA: Insufficient documentation

## 2022-06-29 DIAGNOSIS — M797 Fibromyalgia: Secondary | ICD-10-CM | POA: Insufficient documentation

## 2022-06-29 DIAGNOSIS — G8929 Other chronic pain: Secondary | ICD-10-CM | POA: Insufficient documentation

## 2022-07-13 ENCOUNTER — Other Ambulatory Visit: Payer: Self-pay

## 2022-07-13 ENCOUNTER — Encounter: Payer: Self-pay | Admitting: Physical Medicine & Rehabilitation

## 2022-07-13 ENCOUNTER — Encounter: Payer: Commercial Managed Care - HMO | Admitting: Physical Medicine & Rehabilitation

## 2022-07-13 VITALS — BP 138/87 | HR 72 | Ht 67.0 in | Wt 189.0 lb

## 2022-07-13 DIAGNOSIS — M5441 Lumbago with sciatica, right side: Secondary | ICD-10-CM

## 2022-07-13 DIAGNOSIS — F32A Depression, unspecified: Secondary | ICD-10-CM | POA: Diagnosis not present

## 2022-07-13 DIAGNOSIS — G8929 Other chronic pain: Secondary | ICD-10-CM

## 2022-07-13 DIAGNOSIS — M797 Fibromyalgia: Secondary | ICD-10-CM | POA: Diagnosis present

## 2022-07-13 MED ORDER — PREGABALIN 50 MG PO CAPS
50.0000 mg | ORAL_CAPSULE | Freq: Three times a day (TID) | ORAL | 3 refills | Status: DC
  Filled 2022-07-13: qty 30, 10d supply, fill #0
  Filled 2022-07-13: qty 60, 20d supply, fill #0
  Filled 2022-07-13: qty 30, 10d supply, fill #0
  Filled 2022-07-13: qty 90, 30d supply, fill #0
  Filled 2022-07-13: qty 60, 20d supply, fill #0
  Filled 2022-08-18: qty 90, 30d supply, fill #1
  Filled 2022-08-29 – 2022-09-21 (×2): qty 90, 30d supply, fill #2
  Filled 2022-12-18: qty 90, 30d supply, fill #3
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
## 2022-07-14 ENCOUNTER — Other Ambulatory Visit: Payer: Self-pay

## 2022-07-17 ENCOUNTER — Encounter: Payer: Self-pay | Admitting: *Deleted

## 2022-07-18 ENCOUNTER — Ambulatory Visit: Payer: Commercial Managed Care - HMO | Admitting: Internal Medicine

## 2022-07-19 ENCOUNTER — Telehealth: Payer: Self-pay | Admitting: Nurse Practitioner

## 2022-07-19 ENCOUNTER — Other Ambulatory Visit: Payer: Self-pay | Admitting: Nurse Practitioner

## 2022-07-19 DIAGNOSIS — Z1231 Encounter for screening mammogram for malignant neoplasm of breast: Secondary | ICD-10-CM

## 2022-07-19 NOTE — Telephone Encounter (Signed)
Referral Request - Has patient seen PCP for this complaint? yes *If NO, is insurance requiring patient see PCP for this issue before PCP can refer them? Referral for which specialty: mammogram Preferred provider/office: ? Reason for referral: regular mammogram

## 2022-07-19 NOTE — Telephone Encounter (Signed)
Patient given number to the breast center to schedule an apt. Referral was place in June.

## 2022-07-26 ENCOUNTER — Ambulatory Visit
Admission: RE | Admit: 2022-07-26 | Discharge: 2022-07-26 | Disposition: A | Discharge status: Home / Self Care | Payer: Commercial Managed Care - HMO | Source: Ambulatory Visit | Attending: Nurse Practitioner | Admitting: Nurse Practitioner

## 2022-07-26 DIAGNOSIS — Z1231 Encounter for screening mammogram for malignant neoplasm of breast: Secondary | ICD-10-CM

## 2022-08-09 ENCOUNTER — Encounter: Payer: Self-pay | Admitting: Nurse Practitioner

## 2022-08-09 ENCOUNTER — Other Ambulatory Visit: Payer: Self-pay

## 2022-08-09 ENCOUNTER — Ambulatory Visit: Payer: Commercial Managed Care - HMO | Attending: Nurse Practitioner | Admitting: Nurse Practitioner

## 2022-08-09 VITALS — BP 147/99 | HR 82 | Ht 67.0 in | Wt 186.0 lb

## 2022-08-09 DIAGNOSIS — M797 Fibromyalgia: Secondary | ICD-10-CM

## 2022-08-09 DIAGNOSIS — D229 Melanocytic nevi, unspecified: Secondary | ICD-10-CM

## 2022-08-09 DIAGNOSIS — R8290 Unspecified abnormal findings in urine: Secondary | ICD-10-CM

## 2022-08-09 DIAGNOSIS — I10 Essential (primary) hypertension: Secondary | ICD-10-CM | POA: Diagnosis not present

## 2022-08-09 DIAGNOSIS — N898 Other specified noninflammatory disorders of vagina: Secondary | ICD-10-CM

## 2022-08-09 MED ORDER — LOSARTAN POTASSIUM 100 MG PO TABS
100.0000 mg | ORAL_TABLET | Freq: Every day | ORAL | 1 refills | Status: DC
  Filled 2022-08-09 – 2022-09-06 (×3): qty 30, 30d supply, fill #0
  Filled 2022-10-22: qty 30, 30d supply, fill #1

## 2022-08-09 NOTE — Progress Notes (Signed)
Spot on right palm , back pain

## 2022-08-09 NOTE — Progress Notes (Signed)
Assessment & Plan:  Catherine Garza was seen today for hypertension.  Diagnoses and all orders for this visit:  Primary hypertension -     CMP14+EGFR -     losartan (COZAAR) 100 MG tablet; Take 1 tablet (100 mg total) by mouth daily.  Atypical mole -     Ambulatory referral to Dermatology  Vaginal irritation -     Urinalysis, Complete -     Urine Culture  Cloudy urine -     Urinalysis, Complete -     Urine Culture  Fibromyalgia -     Ambulatory referral to Pain Clinic    Patient has been counseled on age-appropriate routine health concerns for screening and prevention. These are reviewed and up-to-date. Referrals have been placed accordingly. Immunizations are up-to-date or declined.    Subjective:   Chief Complaint  Patient presents with   Hypertension   HPI Catherine Garza 46 y.o. female presents to office today for follow up to HTN   She has a past medical history of Depression and Hypertension   Depression Very tearful and describes feelings of sadness, hopelessness and inadequacy. She states she feels guilty that her fibromyalgia  limits the amount of activity she can perform with her 75 year old daughter. Does not feel her family is emotionally supportive although they do provide physical  with assisting in the care of her daughter and monetary support for her. She Declines SSRI today and she does have a former therapist that she states she will go to when the time is right for her. She does not endorse any current suicidal ideation at this time. She has taken antidepressants before and did not like the way they made her feel so she stopped taking   HTN She is monitoring her blood pressure at home but can not recall any recent readings. I have recommended she send me her last 7 readings via mychart. She is currently taking losartan 100 mg which we may need to switch to valsartan if blood pressure continues elevated.  BP Readings from Last 3 Encounters:  08/09/22 (!)  147/99  07/13/22 138/87  05/19/22 (!) 167/111     SKIN She has a small mole on the inner right palm. States it has increased in circumference over the years and was not present during her childhood. Would like further evaluation. I assured her this was likely benign however she would like a skin specialist opinion.   Vaginitis: Patient complains of an abnormal vaginal irritation for several days. Symptoms are also associated with dysuria.Menstrual pattern: She has been bleeding regularly. Contraception: none   Review of Systems  Constitutional:  Negative for fever, malaise/fatigue and weight loss.  HENT: Negative.  Negative for nosebleeds.   Eyes: Negative.  Negative for blurred vision, double vision and photophobia.  Respiratory: Negative.  Negative for cough and shortness of breath.   Cardiovascular: Negative.  Negative for chest pain, palpitations and leg swelling.  Gastrointestinal: Negative.  Negative for heartburn, nausea and vomiting.  Genitourinary:        SEE HPI  Musculoskeletal:  Positive for myalgias.  Skin:        SEE HPI  Neurological:  Positive for dizziness. Negative for focal weakness, seizures and headaches.  Psychiatric/Behavioral:  Positive for depression. Negative for suicidal ideas. The patient is nervous/anxious.     Past Medical History:  Diagnosis Date   Depression    Hypertension     Past Surgical History:  Procedure Laterality Date   leep  Family History  Problem Relation Age of Onset   Hypertension Mother    Hypertension Father    Ovarian cancer Maternal Grandmother    Throat cancer Paternal Grandfather        smoker     Social History Reviewed with no changes to be made today.   Outpatient Medications Prior to Visit  Medication Sig Dispense Refill   ergocalciferol (VITAMIN D2) 1.25 MG (50000 UT) capsule      ferrous sulfate 325 (65 FE) MG tablet ferrous sulfate 325 mg (65 mg iron) tablet  Take 1 tablet twice a day by oral route.      ibuprofen (ADVIL) 800 MG tablet      Multiple Vitamins-Minerals (WOMENS MULTI GUMMIES PO) Take by mouth.     pregabalin (LYRICA) 50 MG capsule Take 1 capsule (50 mg total) by mouth 3 (three) times daily. 90 capsule 3   losartan (COZAAR) 100 MG tablet Take 1 tablet (100 mg total) by mouth daily. 90 tablet 1   No facility-administered medications prior to visit.    Allergies  Allergen Reactions   Prednisone Other (See Comments)    Caused her to have blood in her stool   Amoxicillin     Other reaction(s): Not available   Latex Hives and Other (See Comments)   Penicillins Hives    Other reaction(s): Not available   Nitrofurantoin Other (See Comments)    Severe pain Other reaction(s): abdominal pain Other reaction(s): abdominal pain       Objective:    BP (!) 147/99   Pulse 82   Ht 5' 7" (1.702 m)   Wt 186 lb (84.4 kg)   LMP 07/14/2022 (Approximate) Comment: Currently on  SpO2 98%   BMI 29.13 kg/m  Wt Readings from Last 3 Encounters:  08/09/22 186 lb (84.4 kg)  07/13/22 189 lb (85.7 kg)  05/19/22 187 lb 9.6 oz (85.1 kg)    Physical Exam Vitals and nursing note reviewed.  Constitutional:      Appearance: She is well-developed.  HENT:     Head: Normocephalic and atraumatic.  Cardiovascular:     Rate and Rhythm: Normal rate and regular rhythm.     Heart sounds: Normal heart sounds. No murmur heard.    No friction rub. No gallop.  Pulmonary:     Effort: Pulmonary effort is normal. No tachypnea or respiratory distress.     Breath sounds: Normal breath sounds. No decreased breath sounds, wheezing, rhonchi or rales.  Chest:     Chest wall: No tenderness.  Abdominal:     General: Bowel sounds are normal.     Palpations: Abdomen is soft.  Musculoskeletal:        General: Normal range of motion.     Cervical back: Normal range of motion.  Skin:    General: Skin is warm and dry.  Neurological:     Mental Status: She is alert and oriented to person, place, and time.      Coordination: Coordination normal.  Psychiatric:        Mood and Affect: Mood is depressed. Affect is tearful.        Behavior: Behavior normal. Behavior is cooperative.        Thought Content: Thought content normal.        Judgment: Judgment normal.          Patient has been counseled extensively about nutrition and exercise as well as the importance of adherence with medications and regular follow-up. The patient  was given clear instructions to go to ER or return to medical center if symptoms don't improve, worsen or new problems develop. The patient verbalized understanding.   Follow-up: Return in about 3 months (around 11/09/2022).   Gildardo Pounds, FNP-BC [facility] and [place], [place]   08/09/2022, 9:11 PM

## 2022-08-10 LAB — CMP14+EGFR
ALT: 12 IU/L (ref 0–32)
AST: 12 IU/L (ref 0–40)
Albumin/Globulin Ratio: 1.8 (ref 1.2–2.2)
Albumin: 4.8 g/dL (ref 3.9–4.9)
Alkaline Phosphatase: 93 IU/L (ref 44–121)
BUN/Creatinine Ratio: 11 (ref 9–23)
BUN: 10 mg/dL (ref 6–24)
Bilirubin Total: 0.3 mg/dL (ref 0.0–1.2)
CO2: 26 mmol/L (ref 20–29)
Calcium: 9.4 mg/dL (ref 8.7–10.2)
Chloride: 104 mmol/L (ref 96–106)
Creatinine, Ser: 0.87 mg/dL (ref 0.57–1.00)
Globulin, Total: 2.6 g/dL (ref 1.5–4.5)
Glucose: 126 mg/dL — ABNORMAL HIGH (ref 70–99)
Potassium: 3.7 mmol/L (ref 3.5–5.2)
Sodium: 142 mmol/L (ref 134–144)
Total Protein: 7.4 g/dL (ref 6.0–8.5)
eGFR: 84 mL/min/{1.73_m2} (ref >59)

## 2022-08-10 LAB — URINALYSIS, COMPLETE
Bilirubin, UA: NEGATIVE
Glucose, UA: NEGATIVE
Leukocytes,UA: NEGATIVE
Nitrite, UA: NEGATIVE
Protein,UA: NEGATIVE
RBC, UA: NEGATIVE
Specific Gravity, UA: 1.025 (ref 1.005–1.030)
Urobilinogen, Ur: 1 mg/dL (ref 0.2–1.0)
pH, UA: 5.5 (ref 5.0–7.5)

## 2022-08-10 LAB — MICROSCOPIC EXAMINATION
Bacteria, UA: NONE SEEN
Casts: NONE SEEN /lpf
RBC, Urine: NONE SEEN /hpf (ref 0–2)
WBC, UA: NONE SEEN /hpf (ref 0–5)

## 2022-08-11 LAB — URINE CULTURE

## 2022-08-18 ENCOUNTER — Other Ambulatory Visit: Payer: Self-pay | Admitting: Nurse Practitioner

## 2022-08-19 ENCOUNTER — Other Ambulatory Visit (HOSPITAL_COMMUNITY): Payer: Self-pay

## 2022-08-21 MED ORDER — FERROUS SULFATE 325 (65 FE) MG PO TABS
325.0000 mg | ORAL_TABLET | Freq: Every day | ORAL | 3 refills | Status: DC
  Filled 2022-08-21: qty 90, 90d supply, fill #0
  Filled 2023-03-13 – 2023-03-19 (×2): qty 90, 90d supply, fill #1

## 2022-08-22 ENCOUNTER — Other Ambulatory Visit (HOSPITAL_COMMUNITY): Payer: Self-pay

## 2022-08-29 ENCOUNTER — Other Ambulatory Visit (HOSPITAL_COMMUNITY): Payer: Self-pay

## 2022-09-06 ENCOUNTER — Telehealth: Payer: Self-pay | Admitting: Emergency Medicine

## 2022-09-06 ENCOUNTER — Other Ambulatory Visit: Payer: Self-pay

## 2022-09-06 ENCOUNTER — Other Ambulatory Visit (HOSPITAL_COMMUNITY): Payer: Self-pay

## 2022-09-06 NOTE — Telephone Encounter (Signed)
Copied from [place] [id number]. Topic: Referral - Question >> Sep 06, 2022  4:16 PM Leilani Able wrote: Pt has called and states was referred to [facility] and and now they want her to do all new bloodwork and she states that this is just too much to do and she cannot do it. She wants Zelda to give her a call. Fu with pt @ [phone]

## 2022-09-09 NOTE — Telephone Encounter (Signed)
I have spoken to patient already regarding this matter

## 2022-09-17 ENCOUNTER — Emergency Department (HOSPITAL_COMMUNITY): Payer: Commercial Managed Care - HMO

## 2022-09-17 ENCOUNTER — Emergency Department (HOSPITAL_COMMUNITY)
Admission: EM | Admit: 2022-09-17 | Discharge: 2022-09-18 | Disposition: A | Discharge status: Home / Self Care | Payer: Commercial Managed Care - HMO | Attending: Emergency Medicine | Admitting: Emergency Medicine

## 2022-09-17 DIAGNOSIS — R059 Cough, unspecified: Secondary | ICD-10-CM | POA: Diagnosis present

## 2022-09-17 DIAGNOSIS — Z79899 Other long term (current) drug therapy: Secondary | ICD-10-CM | POA: Insufficient documentation

## 2022-09-17 DIAGNOSIS — Z1152 Encounter for screening for COVID-19: Secondary | ICD-10-CM | POA: Diagnosis not present

## 2022-09-17 DIAGNOSIS — J101 Influenza due to other identified influenza virus with other respiratory manifestations: Secondary | ICD-10-CM | POA: Diagnosis not present

## 2022-09-17 DIAGNOSIS — I10 Essential (primary) hypertension: Secondary | ICD-10-CM | POA: Insufficient documentation

## 2022-09-17 LAB — POC URINE PREG, ED: Preg Test, Ur: NEGATIVE

## 2022-09-17 MED ORDER — IBUPROFEN 400 MG PO TABS
600.0000 mg | ORAL_TABLET | Freq: Once | ORAL | Status: AC
  Administered 2022-09-17: 600 mg via ORAL
  Filled 2022-09-17: qty 1

## 2022-09-17 NOTE — ED Triage Notes (Signed)
Pt to ED c/o cold symptoms x 3 days, c/o sore throat, nasal congestion, febrile in triage. Reports minimal relief with OTC meds.

## 2022-09-17 NOTE — ED Provider Triage Note (Signed)
Emergency Medicine Provider Triage Evaluation Note  Catherine Garza , a 46 y.o. female  was evaluated in triage.  Pt complains of fever, fatigue, cough, shortness of breathand sore throatx1 week. Now feels like she can't breath and very weak so came to ER. Also would like to be checked for UTI as she has been "wetting" herself.  Review of Systems  Positive: SOB, cough, fever Negative: Abdominal pain  Physical Exam  BP 112/66   Pulse 66   Temp (!) 101.1 F (38.4 C) (Oral)   Resp (!) 24   Ht '5\' 6"'$  (1.676 m)   Wt 83.9 kg   SpO2 100%   BMI 29.86 kg/m  Gen:   Awake, no distress   Resp:  Normal effort  MSK:   Moves extremities without difficulty   Medical Decision Making  Medically screening exam initiated at 11:24 PM.  Appropriate orders placed.  Adelin A Whyte-Pedley was informed that the remainder of the evaluation will be completed by another provider, this initial triage assessment does not replace that evaluation, and the importance of remaining in the ED until their evaluation is complete.     Osvaldo Shipper, [place] 09/17/22 2325

## 2022-09-18 LAB — URINALYSIS, ROUTINE W REFLEX MICROSCOPIC
Bilirubin Urine: NEGATIVE
Glucose, UA: NEGATIVE mg/dL
Hgb urine dipstick: NEGATIVE
Ketones, ur: 80 mg/dL — AB
Leukocytes,Ua: NEGATIVE
Nitrite: NEGATIVE
Protein, ur: 100 mg/dL — AB
Specific Gravity, Urine: 1.02 (ref 1.005–1.030)
pH: 5 (ref 5.0–8.0)

## 2022-09-18 LAB — RESP PANEL BY RT-PCR (RSV, FLU A&B, COVID)  RVPGX2
Influenza A by PCR: POSITIVE — AB
Influenza B by PCR: NEGATIVE
Resp Syncytial Virus by PCR: NEGATIVE
SARS Coronavirus 2 by RT PCR: NEGATIVE

## 2022-09-18 MED ORDER — IBUPROFEN 800 MG PO TABS: 800.0000 mg | ORAL_TABLET | Freq: Three times a day (TID) | ORAL | 0 refills | Status: DC

## 2022-09-18 MED ORDER — BENZONATATE 100 MG PO CAPS: 100.0000 mg | ORAL_CAPSULE | Freq: Three times a day (TID) | ORAL | 0 refills | Status: DC

## 2022-09-18 NOTE — ED Notes (Signed)
Patient verbalizes understanding of discharge instructions. Opportunity for questioning and answers were provided. Armband removed by staff, pt discharged from ED. Pt ambulatory to ED waiting room with steady gait.  

## 2022-09-18 NOTE — Discharge Instructions (Addendum)
You have tested + for flu A. Take the prescribed medication as directed.  Make sure to rest and hydrate. Follow-up with your primary care doctor. Return to the ED for new or worsening symptoms.

## 2022-09-18 NOTE — ED Provider Notes (Signed)
[facility] EMERGENCY DEPARTMENT Provider Note   CSN: [id number] Arrival date & time: 09/17/22  2221     History  Chief Complaint  Patient presents with   URI    Catherine Garza is a 46 y.o. female.  The history is provided by the patient and medical records.  URI Presenting symptoms: cough, fatigue and sore throat    46 year old female with history of anxiety, depression, hypertension, vitamin D deficiency, presenting to the ED with flulike symptoms over the past several days.  She reports fatigue, cough, congestion, chills.  Noted to be febrile on arrival to the ED.  Also requested to be checked for possible UTI as she has been having some urge incontinence.  She denies any frank dysuria or frequency.  She has been taking over-the-counter meds without much relief.  Home Medications Prior to Admission medications   Medication Sig Start Date End Date Taking? Authorizing Provider  benzonatate (TESSALON) 100 MG capsule Take 1 capsule (100 mg total) by mouth every 8 (eight) hours. 09/18/22  Yes Larene Pickett, PA-C  ibuprofen (ADVIL) 800 MG tablet Take 1 tablet (800 mg total) by mouth 3 (three) times daily. 09/18/22  Yes Larene Pickett, PA-C  ergocalciferol (VITAMIN D2) 1.25 MG (50000 UT) capsule     [provider]  ferrous sulfate 325 (65 FE) MG tablet Take 1 tablet (325 mg total) by mouth daily with breakfast. 08/21/22   Gildardo Pounds, NP  losartan (COZAAR) 100 MG tablet Take 1 tablet (100 mg total) by mouth daily. 08/09/22   Gildardo Pounds, NP  Multiple Vitamins-Minerals (WOMENS MULTI GUMMIES PO) Take by mouth.    [provider]  pregabalin (LYRICA) 50 MG capsule Take 1 capsule (50 mg total) by mouth 3 (three) times daily. 07/13/22   Jennye Boroughs, MD      Allergies    Prednisone, Amoxicillin, Latex, Penicillins, and Nitrofurantoin    Review of Systems   Review of Systems  Constitutional:  Positive for chills and fatigue.   HENT:  Positive for sore throat.   Respiratory:  Positive for cough.   All other systems reviewed and are negative.   Physical Exam Updated Vital Signs BP 124/84   Pulse 83   Temp 98.4 F (36.9 C) (Oral)   Resp 19   Ht '5\' 6"'$  (1.676 m)   Wt 83.9 kg   SpO2 98%   BMI 29.86 kg/m   Physical Exam Vitals and nursing note reviewed.  Constitutional:      Appearance: She is well-developed.     Comments: Seems to be feeling poorly but non-toxic, NAD  HENT:     Head: Normocephalic and atraumatic.  Eyes:     Conjunctiva/sclera: Conjunctivae normal.     Pupils: Pupils are equal, round, and reactive to light.  Cardiovascular:     Rate and Rhythm: Normal rate and regular rhythm.     Heart sounds: Normal heart sounds.  Pulmonary:     Effort: Pulmonary effort is normal.     Breath sounds: Normal breath sounds. No wheezing or rhonchi.     Comments: Lungs CTAB Abdominal:     General: Bowel sounds are normal.     Palpations: Abdomen is soft.     Tenderness: There is no abdominal tenderness. There is no rebound.  Musculoskeletal:        General: Normal range of motion.     Cervical back: Normal range of motion.  Skin:    General:  Skin is warm and dry.  Neurological:     Mental Status: She is alert and oriented to person, place, and time.     ED Results / Procedures / Treatments   Labs (all labs ordered are listed, but only abnormal results are displayed) Labs Reviewed  RESP PANEL BY RT-PCR (RSV, FLU A&B, COVID)  RVPGX2 - Abnormal; Notable for the following components:      Result Value   Influenza A by PCR POSITIVE (*)    All other components within normal limits  URINALYSIS, ROUTINE W REFLEX MICROSCOPIC - Abnormal; Notable for the following components:   APPearance HAZY (*)    Ketones, ur 80 (*)    Protein, ur 100 (*)    Bacteria, UA RARE (*)    All other components within normal limits  POC URINE PREG, ED    EKG None  Radiology DG Chest 2 View  Result Date:  09/17/2022 CLINICAL DATA:  Shortness of breath and cough. EXAM: CHEST - 2 VIEW COMPARISON:  None Available. FINDINGS: Minimal left lung base atelectasis. No focal consolidation, pleural effusion or pneumothorax. The cardiac silhouette is within normal limits. No acute osseous pathology. IMPRESSION: No active cardiopulmonary disease. Electronically Signed   By: Anner Crete M.D.   On: 09/17/2022 23:51    Procedures Procedures    Medications Ordered in ED Medications  ibuprofen (ADVIL) tablet 600 mg (600 mg Oral Given 09/17/22 2336)    ED Course/ Medical Decision Making/ A&P                           Medical Decision Making Amount and/or Complexity of Data Reviewed Radiology: ordered and independent interpretation performed. ECG/medicine tests: ordered and independent interpretation performed.  Risk Prescription drug management.   46 year old female presenting to the ED with 1 week of URI type symptoms.  She denies any sick contacts.  Febrile on arrival but defervesced with Motrin.  She is nontoxic in appearance.  Her lungs are clear without any wheezes or rhonchi.  She denies any chest pain or shortness of breath.  Chest x-ray is clear.  RVP is positive for influenza A.  Patient had also requested to be checked for UTI as she has been having some urge incontinence recently, no signs of infection.  She is not having any frank bowel or bladder incontinence.  No saddle anesthesias.  No back pain.  Feel she is stable for discharge with symptomatic care.  Recommended that she rest and hydrate, follow-up with PCP.  Return here for new concerns.  Final Clinical Impression(s) / ED Diagnoses Final diagnoses:  Influenza A    Rx / DC Orders ED Discharge Orders          Ordered    benzonatate (TESSALON) 100 MG capsule  Every 8 hours        09/18/22 0205    ibuprofen (ADVIL) 800 MG tablet  3 times daily        09/18/22 0205              Larene Pickett, PA-C 09/18/22 0225     Orpah Greek, MD 09/18/22 [id number]

## 2022-09-21 ENCOUNTER — Other Ambulatory Visit (HOSPITAL_COMMUNITY): Payer: Self-pay

## 2022-09-21 ENCOUNTER — Other Ambulatory Visit: Payer: Self-pay | Admitting: Nurse Practitioner

## 2022-09-21 ENCOUNTER — Other Ambulatory Visit: Payer: Self-pay

## 2022-10-23 ENCOUNTER — Other Ambulatory Visit: Payer: Self-pay

## 2022-11-08 ENCOUNTER — Ambulatory Visit: Payer: Commercial Managed Care - HMO | Attending: Nurse Practitioner | Admitting: Nurse Practitioner

## 2022-11-08 ENCOUNTER — Encounter: Payer: Self-pay | Admitting: Gastroenterology

## 2022-11-08 ENCOUNTER — Encounter: Payer: Self-pay | Admitting: Nurse Practitioner

## 2022-11-08 ENCOUNTER — Other Ambulatory Visit: Payer: Self-pay

## 2022-11-08 VITALS — BP 129/87 | HR 75 | Ht 66.0 in | Wt 187.9 lb

## 2022-11-08 DIAGNOSIS — Z1211 Encounter for screening for malignant neoplasm of colon: Secondary | ICD-10-CM

## 2022-11-08 DIAGNOSIS — I10 Essential (primary) hypertension: Secondary | ICD-10-CM

## 2022-11-08 MED ORDER — LOSARTAN POTASSIUM 100 MG PO TABS
100.0000 mg | ORAL_TABLET | Freq: Every day | ORAL | 1 refills | Status: DC
  Filled 2022-11-08 – 2022-11-25 (×2): qty 90, 90d supply, fill #0
  Filled 2023-03-13 – 2023-03-19 (×2): qty 90, 90d supply, fill #1

## 2022-11-08 NOTE — Progress Notes (Signed)
Assessment & Plan:  Catherine Garza was seen today for hypertension.  Diagnoses and all orders for this visit:  Primary hypertension -     losartan (COZAAR) 100 MG tablet; Take 1 tablet (100 mg total) by mouth daily. Continue all antihypertensives as prescribed.  Reminded to bring in blood pressure log for follow  up appointment.  RECOMMENDATIONS: DASH/Mediterranean Diets are healthier choices for HTN.    Colon cancer screening -     Ambulatory referral to Gastroenterology    Patient has been counseled on age-appropriate routine health concerns for screening and prevention. These are reviewed and up-to-date. Referrals have been placed accordingly. Immunizations are up-to-date or declined.    Subjective:   Chief Complaint  Patient presents with   Hypertension    Catherine Garza 47 y.o. female presents to office today for follow up to HTN  She has a past medical history of Depression and Hypertension     HTN She is monitoring her blood pressure at home. She is currently taking losartan 100 mg and blood pressure is well controlled today.   BP Readings from Last 3 Encounters:  11/08/22 129/87  09/18/22 124/84  08/09/22 (!) 147/99     Fibromyalgia She stopped taking pregabalin in December which she was taking for fibromyalgia. She wants to take tylenol instead at this time. Felt electric shocks in her head when she took lyrica with an alcoholic beverage over the holidays.   Review of Systems  Constitutional:  Negative for fever, malaise/fatigue and weight loss.  HENT: Negative.  Negative for nosebleeds.   Eyes: Negative.  Negative for blurred vision, double vision and photophobia.  Respiratory: Negative.  Negative for cough and shortness of breath.   Cardiovascular: Negative.  Negative for chest pain, palpitations and leg swelling.  Gastrointestinal: Negative.  Negative for heartburn, nausea and vomiting.  Musculoskeletal: Negative.  Negative for myalgias.  Neurological:  Negative.  Negative for dizziness, focal weakness, seizures and headaches.  Psychiatric/Behavioral: Negative.  Negative for suicidal ideas.     Past Medical History:  Diagnosis Date   Depression    Hypertension     Past Surgical History:  Procedure Laterality Date   leep      Family History  Problem Relation Age of Onset   Hypertension Mother    Hypertension Father    Ovarian cancer Maternal Grandmother    Throat cancer Paternal Grandfather        smoker     Social History Reviewed with no changes to be made today.   Outpatient Medications Prior to Visit  Medication Sig Dispense Refill   ergocalciferol (VITAMIN D2) 1.25 MG (50000 UT) capsule      ibuprofen (ADVIL) 800 MG tablet Take 1 tablet (800 mg total) by mouth 3 (three) times daily. 21 tablet 0   Multiple Vitamins-Minerals (WOMENS MULTI GUMMIES PO) Take by mouth.     losartan (COZAAR) 100 MG tablet Take 1 tablet (100 mg total) by mouth daily. 30 tablet 1   ferrous sulfate 325 (65 FE) MG tablet Take 1 tablet (325 mg total) by mouth daily with breakfast. (Patient not taking: Reported on 11/08/2022) 90 tablet 3   pregabalin (LYRICA) 50 MG capsule Take 1 capsule (50 mg total) by mouth 3 (three) times daily. (Patient not taking: Reported on 11/08/2022) 90 capsule 3   benzonatate (TESSALON) 100 MG capsule Take 1 capsule (100 mg total) by mouth every 8 (eight) hours. 21 capsule 0   No facility-administered medications prior to visit.  Allergies  Allergen Reactions   Prednisone Other (See Comments)    Caused her to have blood in her stool   Amoxicillin     Other reaction(s): Not available   Latex Hives and Other (See Comments)   Penicillins Hives    Other reaction(s): Not available   Nitrofurantoin Other (See Comments)    Severe pain Other reaction(s): abdominal pain Other reaction(s): abdominal pain       Objective:    BP 129/87   Pulse 75   Ht 5' 6"$  (1.676 m)   Wt 187 lb 14.4 oz (85.2 kg)   LMP 10/23/2022    SpO2 100%   BMI 30.33 kg/m  Wt Readings from Last 3 Encounters:  11/08/22 187 lb 14.4 oz (85.2 kg)  09/17/22 185 lb (83.9 kg)  08/09/22 186 lb (84.4 kg)    Physical Exam Vitals and nursing note reviewed.  Constitutional:      Appearance: She is well-developed.  HENT:     Head: Normocephalic and atraumatic.  Cardiovascular:     Rate and Rhythm: Normal rate and regular rhythm.     Heart sounds: Normal heart sounds. No murmur heard.    No friction rub. No gallop.  Pulmonary:     Effort: Pulmonary effort is normal. No tachypnea or respiratory distress.     Breath sounds: Normal breath sounds. No decreased breath sounds, wheezing, rhonchi or rales.  Chest:     Chest wall: No tenderness.  Abdominal:     General: Bowel sounds are normal.     Palpations: Abdomen is soft.  Musculoskeletal:        General: Normal range of motion.     Cervical back: Normal range of motion.  Skin:    General: Skin is warm and dry.  Neurological:     Mental Status: She is alert and oriented to person, place, and time.     Coordination: Coordination normal.  Psychiatric:        Behavior: Behavior normal. Behavior is cooperative.        Thought Content: Thought content normal.        Judgment: Judgment normal.          Patient has been counseled extensively about nutrition and exercise as well as the importance of adherence with medications and regular follow-up. The patient was given clear instructions to go to ER or return to medical center if symptoms don't improve, worsen or new problems develop. The patient verbalized understanding.   Follow-up: Return in about 6 months (around 05/09/2023) for pap.   Catherine Pounds, FNP-BC [facility] and [facility] [place], [facility]   11/08/2022, 10:24 AM

## 2022-11-25 ENCOUNTER — Other Ambulatory Visit (HOSPITAL_COMMUNITY): Payer: Self-pay

## 2022-11-25 ENCOUNTER — Other Ambulatory Visit: Payer: Self-pay | Admitting: Nurse Practitioner

## 2022-11-27 ENCOUNTER — Other Ambulatory Visit: Payer: Self-pay

## 2022-12-12 ENCOUNTER — Ambulatory Visit: Payer: Commercial Managed Care - HMO | Admitting: Physical Medicine & Rehabilitation

## 2022-12-13 ENCOUNTER — Encounter: Payer: Self-pay | Admitting: Gastroenterology

## 2022-12-13 ENCOUNTER — Other Ambulatory Visit: Payer: Self-pay

## 2022-12-13 ENCOUNTER — Ambulatory Visit (INDEPENDENT_AMBULATORY_CARE_PROVIDER_SITE_OTHER): Payer: Commercial Managed Care - HMO | Admitting: Gastroenterology

## 2022-12-13 VITALS — BP 130/90 | HR 94 | Ht 66.5 in | Wt 181.0 lb

## 2022-12-13 DIAGNOSIS — Z1211 Encounter for screening for malignant neoplasm of colon: Secondary | ICD-10-CM

## 2022-12-13 MED ORDER — PLENVU 140 G PO SOLR
1.0000 | Freq: Once | ORAL | 0 refills | Status: AC
  Filled 2022-12-13: qty 3, 1d supply, fill #0

## 2022-12-13 NOTE — Patient Instructions (Signed)
You have been scheduled for a colonoscopy. Please follow written instructions given to you at your visit today.  Please pick up your prep supplies at the pharmacy within the next 1-3 days. If you use inhalers (even only as needed), please bring them with you on the day of your procedure.   _______________________________________________________  If your blood pressure at your visit was 140/90 or greater, please contact your primary care physician to follow up on this.  _______________________________________________________  If you are age 57 or older, your body mass index should be between 23-30. Your Body mass index is 28.78 kg/m. If this is out of the aforementioned range listed, please consider follow up with your Primary Care Provider.  If you are age 43 or younger, your body mass index should be between 19-25. Your Body mass index is 28.78 kg/m. If this is out of the aformentioned range listed, please consider follow up with your Primary Care Provider.   ________________________________________________________  The  GI providers would like to encourage you to use [facility] to communicate with providers for non-urgent requests or questions.  Due to long hold times on the telephone, sending your provider a message by [facility] may be a faster and more efficient way to get a response.  Please allow 48 business hours for a response.  Please remember that this is for non-urgent requests.  _______________________________________________________   Due to recent changes in healthcare laws, you may see the results of your imaging and laboratory studies on MyChart before your provider has had a chance to review them.  We understand that in some cases there may be results that are confusing or concerning to you. Not all laboratory results come back in the same time frame and the provider may be waiting for multiple results in order to interpret others.  Please give [place] 48 hours in order for your  provider to thoroughly review all the results before contacting the office for clarification of your results.    I appreciate the  opportunity to care for you  Thank You   Janett Billow Zehr,PA-C

## 2022-12-13 NOTE — Progress Notes (Signed)
12/13/2022 Catherine Garza [id number] 05/01/1976   HISTORY OF PRESENT ILLNESS: This is a 47 year old female who is new to our office.  She has been referred here by her PCP, Geryl Rankins, NP, for colorectal cancer screening.  She has never had a colonoscopy in the past.  She says that she does have some mild constipation on occasion, which she usually manages with some vegetable laxative type tea, MiraLAX, and increasing water in her diet.  She does see occasional bright red blood per rectum that she attributes to hemorrhoids.  She is asking if she could have some type of worm or parasite.  Does not have any diarrhea.  No significant abdominal pain.  Overall appetite is okay.  She was talking about her daughter eating some lunch meat back in December and then she has eaten after her daughter, etc.   Past Medical History:  Diagnosis Date   Depression    Hypertension    Past Surgical History:  Procedure Laterality Date   leep      reports that she has never smoked. She has never used smokeless tobacco. She reports current alcohol use. She reports that she does not use drugs. family history includes Hypertension in her father and mother; Ovarian cancer in her maternal grandmother; Throat cancer in her paternal grandfather. Allergies  Allergen Reactions   Prednisone Other (See Comments)    Caused her to have blood in her stool   Amoxicillin     Other reaction(s): Not available   Latex Hives and Other (See Comments)   Penicillins Hives    Other reaction(s): Not available   Nitrofurantoin Other (See Comments)    Severe pain Other reaction(s): abdominal pain Other reaction(s): abdominal pain      Outpatient Encounter Medications as of 12/13/2022  Medication Sig   ergocalciferol (VITAMIN D2) 1.25 MG (50000 UT) capsule    ferrous sulfate 325 (65 FE) MG tablet Take 1 tablet (325 mg total) by mouth daily with breakfast.   ibuprofen (ADVIL) 800 MG tablet Take 1 tablet (800 mg  total) by mouth 3 (three) times daily.   losartan (COZAAR) 100 MG tablet Take 1 tablet (100 mg total) by mouth daily.   Multiple Vitamins-Minerals (WOMENS MULTI GUMMIES PO) Take by mouth.   pregabalin (LYRICA) 50 MG capsule Take 1 capsule (50 mg total) by mouth 3 (three) times daily.   No facility-administered encounter medications on file as of 12/13/2022.     REVIEW OF SYSTEMS  : All other systems reviewed and negative except where noted in the History of Present Illness.   PHYSICAL EXAM: BP (!) 130/90   Pulse 94   Ht 5' 6.5" (1.689 m)   Wt 181 lb (82.1 kg)   BMI 28.78 kg/m  General: Well developed female in no acute distress Head: Normocephalic and atraumatic Eyes:  Sclerae anicteric, conjunctiva pink. Ears: Normal auditory acuity Lungs: Clear throughout to auscultation; no W/R/R. Heart: Regular rate and rhythm; no M/R/G. Abdomen: Soft, non-distended.  BS present.  Mild right mid-abdominal TTP. Rectal:  Will be done at the time of colonoscopy. Musculoskeletal: Symmetrical with no gross deformities  Skin: No lesions on visible extremities Extremities: No edema  Neurological: Alert oriented x 4, grossly non-focal Psychological:  Alert and cooperative. Normal mood and affect  ASSESSMENT AND PLAN: *Colorectal cancer screening: Never had colonoscopy in the past.  Will schedule with Dr. Loletha Carrow.  The risks, benefits, and alternatives to colonoscopy were discussed with the patient and she consents  to proceed.  *Mild constipation: This only happens on occasion.  Manages it well overall with laxative/senna type tea, MiraLAX, increasing her water intake, etc.   CC:  Gildardo Pounds, NP

## 2022-12-14 NOTE — Progress Notes (Signed)
____________________________________________________________  Attending physician addendum:  Thank you for sending this case to me. I have reviewed the entire note and agree with the plan.    Danis, MD  ____________________________________________________________  

## 2022-12-18 ENCOUNTER — Other Ambulatory Visit: Payer: Self-pay | Admitting: Nurse Practitioner

## 2022-12-19 ENCOUNTER — Other Ambulatory Visit: Payer: Self-pay

## 2022-12-19 ENCOUNTER — Other Ambulatory Visit (HOSPITAL_COMMUNITY): Payer: Self-pay

## 2022-12-20 ENCOUNTER — Ambulatory Visit: Payer: Commercial Managed Care - HMO | Admitting: Physician Assistant

## 2023-01-01 ENCOUNTER — Encounter: Payer: Self-pay | Admitting: Physical Medicine & Rehabilitation

## 2023-01-01 ENCOUNTER — Encounter
Payer: Commercial Managed Care - HMO | Attending: Physical Medicine & Rehabilitation | Admitting: Physical Medicine & Rehabilitation

## 2023-01-01 VITALS — BP 138/89 | HR 77 | Ht 66.5 in | Wt 190.8 lb

## 2023-01-01 DIAGNOSIS — M797 Fibromyalgia: Secondary | ICD-10-CM | POA: Diagnosis not present

## 2023-01-01 DIAGNOSIS — G8929 Other chronic pain: Secondary | ICD-10-CM | POA: Insufficient documentation

## 2023-01-01 DIAGNOSIS — F32A Depression, unspecified: Secondary | ICD-10-CM | POA: Insufficient documentation

## 2023-01-01 DIAGNOSIS — M5441 Lumbago with sciatica, right side: Secondary | ICD-10-CM | POA: Insufficient documentation
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
## 2023-01-03 ENCOUNTER — Ambulatory Visit (AMBULATORY_SURGERY_CENTER): Payer: Commercial Managed Care - HMO | Admitting: Gastroenterology

## 2023-01-03 ENCOUNTER — Encounter: Payer: Self-pay | Admitting: Gastroenterology

## 2023-01-03 VITALS — BP 132/78 | HR 78 | Temp 97.5°F | Resp 14 | Ht 66.5 in | Wt 181.0 lb

## 2023-01-03 DIAGNOSIS — D124 Benign neoplasm of descending colon: Secondary | ICD-10-CM

## 2023-01-03 DIAGNOSIS — D128 Benign neoplasm of rectum: Secondary | ICD-10-CM | POA: Diagnosis not present

## 2023-01-03 DIAGNOSIS — Z1211 Encounter for screening for malignant neoplasm of colon: Secondary | ICD-10-CM | POA: Diagnosis not present

## 2023-01-03 MED ORDER — SODIUM CHLORIDE 0.9 % IV SOLN: 500.0000 mL | Freq: Once | INTRAVENOUS | Status: DC

## 2023-01-03 NOTE — Patient Instructions (Signed)
Recommendation:  - Patient has a contact number available for emergencies.  -The signs and symptoms of potential delayed complications were discussed with the patient.  -Return to normal activities tomorrow.  -Written discharge instructions were provided to the patient.  - Resume previous diet.  - Continue present medications.  - Await pathology results. - Repeat colonoscopy is recommended for surveillance. The colonoscopy date will be determined after pathology results from today' s exam become available for review.  YOU HAD AN ENDOSCOPIC PROCEDURE TODAY AT THE [facility]:   Refer to the procedure report that was given to you for any specific questions about what was found during the examination.  If the procedure report does not answer your questions, please call your gastroenterologist to clarify.  If you requested that your care partner not be given the details of your procedure findings, then the procedure report has been included in a sealed envelope for you to review at your convenience later.  YOU SHOULD EXPECT: Some feelings of bloating in the abdomen. Passage of more gas than usual.  Walking can help get rid of the air that was put into your GI tract during the procedure and reduce the bloating. If you had a lower endoscopy (such as a colonoscopy or flexible sigmoidoscopy) you may notice spotting of blood in your stool or on the toilet paper. If you underwent a bowel prep for your procedure, you may not have a normal bowel movement for a few days.  Please Note:  You might notice some irritation and congestion in your nose or some drainage.  This is from the oxygen used during your procedure.  There is no need for concern and it should clear up in a day or so.  SYMPTOMS TO REPORT IMMEDIATELY:  Following lower endoscopy (colonoscopy or flexible sigmoidoscopy):  Excessive amounts of blood in the stool  Significant tenderness or worsening of abdominal pains  Swelling of the  abdomen that is new, acute  Fever of 100F or higher   For urgent or emergent issues, a gastroenterologist can be reached at any hour by calling [phone]. Do not use MyChart messaging for urgent concerns.    DIET:  We do recommend a small meal at first, but then you may proceed to your regular diet.  Drink plenty of fluids but you should avoid alcoholic beverages for 24 hours.  MEDICATIONS: Continue present medications.  Please see handouts given to you by your recovery nurse: Polyps.  FOLLOW UP: Await pathology results. Repeat colonoscopy is recommended for surveillance. Date will be determined after Dr. Myrtie Neither has reviewed your pathology results.  Thank you for allowing [place] to provide for your healthcare needs today.  ACTIVITY:  You should plan to take it easy for the rest of today and you should NOT DRIVE or use heavy machinery until tomorrow (because of the sedation medicines used during the test).    FOLLOW UP: Our staff will call the number listed on your records the next business day following your procedure.  We will call around 7:15- 8:00 am to check on you and address any questions or concerns that you may have regarding the information given to you following your procedure. If we do not reach you, we will leave a message.     If any biopsies were taken you will be contacted by phone or by letter within the next 1-3 weeks.  Please call us at [phone] if you have not heard about the biopsies in 3 weeks.  SIGNATURES/CONFIDENTIALITY: You and/or your care partner have signed paperwork which will be entered into your electronic medical record.  These signatures attest to the fact that that the information above on your After Visit Summary has been reviewed and is understood.  Full responsibility of the confidentiality of this discharge information lies with you and/or your care-partner.

## 2023-01-03 NOTE — Progress Notes (Signed)
Called to room to assist during endoscopic procedure.  Patient ID and intended procedure confirmed with present staff. Received instructions for my participation in the procedure from the performing physician.  

## 2023-01-03 NOTE — Op Note (Signed)
[facility] Patient Name: Catherine Garza Procedure Date: 01/03/2023 9:22 AM MRN: [id number] Endoscopist: Sherilyn CooterHenry L. Myrtie Neitheranis , MD, [id number] Age: 47 Referring MD:  Date of Birth: 12/10/1975 Gender: Female Account #: [id number] Procedure:                Colonoscopy Indications:              Screening for colorectal malignant neoplasm, This                            is the patient's first colonoscopy Medicines:                Monitored Anesthesia Care Procedure:                Pre-Anesthesia Assessment:                           - Prior to the procedure, a History and Physical                            was performed, and patient medications and                            allergies were reviewed. The patient's tolerance of                            previous anesthesia was also reviewed. The risks                            and benefits of the procedure and the sedation                            options and risks were discussed with the patient.                            All questions were answered, and informed consent                            was obtained. Prior Anticoagulants: The patient has                            taken no anticoagulant or antiplatelet agents. ASA                            Grade Assessment: II - A patient with mild systemic                            disease. After reviewing the risks and benefits,                            the patient was deemed in satisfactory condition to                            undergo the procedure.  After obtaining informed consent, the colonoscope                            was passed under direct vision. Throughout the                            procedure, the patient's blood pressure, pulse, and                            oxygen saturations were monitored continuously. The                            Olympus CF-HQ190L [id number]) Colonoscope was                            introduced through the  anus and advanced to the the                            cecum, identified by appendiceal orifice and                            ileocecal valve. The colonoscopy was performed                            without difficulty. The patient tolerated the                            procedure well. The quality of the bowel                            preparation was excellent. The ileocecal valve,                            appendiceal orifice, and rectum were photographed. Scope In: 9:24:43 AM Scope Out: 9:54:06 AM Scope Withdrawal Time: 0 hours 26 minutes 16 seconds  Total Procedure Duration: 0 hours 29 minutes 23 seconds  Findings:                 The perianal and digital rectal examinations were                            normal.                           A diminutive polyp was found in the descending                            colon. The polyp was sessile. The polyp was removed                            with a cold snare. Resection and retrieval were                            complete.  A 12-15 mm polyp was found in the proximal rectum                            (just above the most proximal rectal fold). The                            polyp was semi-pedunculated. Preparations were made                            for mucosal resection. Demarcation of the lesion                            was performed with high-definition white light and                            narrow band imaging to clearly identify the                            boundaries of the lesion. 1cc of a 0.01 mg/mL                            solution of epinephrine was injected to raise the                            lesion (and to decrease post-polypectomy bleeding).                            Snare mucosal resection was performed. Resection                            and retrieval were complete. Resected tissue                            margins were examined and clear of polyp tissue.                            The exam was otherwise without abnormality on                            direct and retroflexion views. Complications:            No immediate complications. Estimated Blood Loss:     Estimated blood loss was minimal. Impression:               - One diminutive polyp in the descending colon,                            removed with a cold snare. Resected and retrieved.                           - One 12-15 mm polyp in the proximal rectum,                            removed with mucosal  resection. Resected and                            retrieved.                           - The examination was otherwise normal on direct                            and retroflexion views.                           - Mucosal resection was performed. Resection and                            retrieval were complete.                           - The GI Genius (intelligent endoscopy module),                            computer-aided polyp detection system powered by AI                            was utilized to detect colorectal polyps through                            enhanced visualization during colonoscopy. Recommendation:           - Patient has a contact number available for                            emergencies. The signs and symptoms of potential                            delayed complications were discussed with the                            patient. Return to normal activities tomorrow.                            Written discharge instructions were provided to the                            patient.                           - Resume previous diet.                           - Continue present medications.                           - Await pathology results.                           - Repeat colonoscopy is recommended for  surveillance. The colonoscopy date will be                            determined after pathology results from today's                             exam become available for review.  L. Myrtie Neither, MD 01/03/2023 10:01:23 AM This report has been signed electronically.

## 2023-01-03 NOTE — Progress Notes (Signed)
No changes to clinical history since GI office visit on 12/13/22.  The patient is appropriate for an endoscopic procedure in the ambulatory setting.  -  Danis, MD    

## 2023-01-03 NOTE — Progress Notes (Signed)
Report to PACU, RN, vss, BBS= Clear.  

## 2023-01-04 ENCOUNTER — Telehealth: Payer: Self-pay

## 2023-01-04 NOTE — Telephone Encounter (Signed)
  Follow up Call-     01/03/2023    8:41 AM  Call back number  Post procedure Call Back phone  # [phone]  Permission to leave phone message Yes     Patient questions:  Do you have a fever, pain , or abdominal swelling? No. Pain Score  0 *  Have you tolerated food without any problems? Yes.    Have you been able to return to your normal activities? Yes.    Do you have any questions about your discharge instructions: Diet   No. Medications  No. Follow up visit  No.  Do you have questions or concerns about your Care? Yes.    Actions: * If pain score is 4 or above: No action needed, pain <4. Patient c/o right eye pain. Today rates pain 4,pain was 7 yesterday. Reports no redness or vision problems. Advised patient to apply warm compress to the affected area and contract primary provider or her eye doctor if symptoms don't improve or they worsen. Patient agreed to POC

## 2023-01-09 ENCOUNTER — Encounter: Payer: Self-pay | Admitting: Gastroenterology

## 2023-01-23 ENCOUNTER — Encounter: Payer: Self-pay | Admitting: Physical Medicine and Rehabilitation

## 2023-01-24 ENCOUNTER — Other Ambulatory Visit: Payer: Self-pay | Admitting: Physical Medicine and Rehabilitation

## 2023-01-24 DIAGNOSIS — F331 Major depressive disorder, recurrent, moderate: Secondary | ICD-10-CM | POA: Diagnosis not present

## 2023-01-24 DIAGNOSIS — M5416 Radiculopathy, lumbar region: Secondary | ICD-10-CM

## 2023-01-24 DIAGNOSIS — N898 Other specified noninflammatory disorders of vagina: Secondary | ICD-10-CM | POA: Diagnosis not present

## 2023-02-01 ENCOUNTER — Encounter
Payer: Commercial Managed Care - HMO | Attending: Physical Medicine & Rehabilitation | Admitting: Physical Medicine & Rehabilitation

## 2023-02-01 ENCOUNTER — Encounter: Payer: Self-pay | Admitting: Physical Medicine & Rehabilitation

## 2023-02-01 ENCOUNTER — Encounter: Payer: Commercial Managed Care - HMO | Admitting: Physical Medicine & Rehabilitation

## 2023-02-01 VITALS — BP 128/88 | HR 79 | Ht 66.5 in | Wt 190.0 lb

## 2023-02-01 DIAGNOSIS — M797 Fibromyalgia: Secondary | ICD-10-CM

## 2023-02-01 DIAGNOSIS — G8929 Other chronic pain: Secondary | ICD-10-CM

## 2023-02-01 DIAGNOSIS — F32A Depression, unspecified: Secondary | ICD-10-CM | POA: Diagnosis not present

## 2023-02-01 DIAGNOSIS — M5441 Lumbago with sciatica, right side: Secondary | ICD-10-CM | POA: Diagnosis not present
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
## 2023-02-07 ENCOUNTER — Other Ambulatory Visit: Payer: Self-pay

## 2023-02-07 ENCOUNTER — Ambulatory Visit (INDEPENDENT_AMBULATORY_CARE_PROVIDER_SITE_OTHER): Payer: Commercial Managed Care - HMO | Admitting: Physical Medicine and Rehabilitation

## 2023-02-07 VITALS — BP 140/92 | HR 68

## 2023-02-07 DIAGNOSIS — M5416 Radiculopathy, lumbar region: Secondary | ICD-10-CM | POA: Diagnosis not present

## 2023-02-07 MED ORDER — METHYLPREDNISOLONE ACETATE 80 MG/ML IJ SUSP
80.0000 mg | Freq: Once | INTRAMUSCULAR | Status: AC
  Administered 2023-02-07: 80 mg

## 2023-02-07 NOTE — Progress Notes (Signed)
Functional Pain Scale - descriptive words and definitions  Distressing (6)    Pain is present/unable to complete most ADLs limited by pain/sleep is difficult and active distraction is only marginal. Moderate range order  Average Pain  varies   +Driver, -BT, -Dye Allergies.  Lower back pain on right side that can radiate into right leg

## 2023-02-07 NOTE — Patient Instructions (Signed)

## 2023-02-15 NOTE — Procedures (Signed)
Lumbar Epidural Steroid Injection - Interlaminar Approach with Fluoroscopic Guidance  Patient: Catherine Garza      Date of Birth: 06/23/1976 MRN: [id number] PCP: Claiborne Rigg, NP      Visit Date: 02/07/2023   Universal Protocol:     Consent Given By: the patient  Position: PRONE  Additional Comments: Vital signs were monitored before and after the procedure. Patient was prepped and draped in the usual sterile fashion. The correct patient, procedure, and site was verified.   Injection Procedure Details:   Procedure diagnoses: Lumbar radiculopathy [M54.16]   Meds Administered:  Meds ordered this encounter  Medications   methylPREDNISolone acetate (DEPO-MEDROL) injection 80 mg     Laterality: Right  Location/Site:  L5-S1  Needle: 3.5 in., 20 ga. Tuohy  Needle Placement: Paramedian epidural  Findings:   -Comments: Excellent flow of contrast into the epidural space.  Procedure Details: Using a paramedian approach from the side mentioned above, the region overlying the inferior lamina was localized under fluoroscopic visualization and the soft tissues overlying this structure were infiltrated with 4 ml. of 1% Lidocaine without Epinephrine. The Tuohy needle was inserted into the epidural space using a paramedian approach.   The epidural space was localized using loss of resistance along with counter oblique bi-planar fluoroscopic views.  After negative aspirate for air, blood, and CSF, a 2 ml. volume of Isovue-250 was injected into the epidural space and the flow of contrast was observed. Radiographs were obtained for documentation purposes.    The injectate was administered into the level noted above.   Additional Comments:  The patient tolerated the procedure well Dressing: 2 x 2 sterile gauze and Band-Aid    Post-procedure details: Patient was observed during the procedure. Post-procedure instructions were reviewed.  Patient left the clinic in stable  condition.

## 2023-02-15 NOTE — Progress Notes (Signed)
Catherine Garza - 47 y.o. female MRN [id number]  Date of birth: 26-Sep-1975  Office Visit Note: Visit Date: 02/07/2023 PCP: Claiborne Rigg, NP Referred by: Claiborne Rigg, NP  Subjective: Chief Complaint  Patient presents with   Lower Back - Pain   HPI:  Catherine Garza is a 47 y.o. female who comes in today at the request of Ellin Goodie, FNP for planned Right L5-S1 Lumbar Interlaminar epidural steroid injection with fluoroscopic guidance.  The patient has failed conservative care including home exercise, medications, time and activity modification.  This injection will be diagnostic and hopefully therapeutic.  Please see requesting physician notes for further details and justification.   ROS Otherwise per HPI.  Assessment & Plan: Visit Diagnoses:    ICD-10-CM   1. Lumbar radiculopathy  M54.16 XR C-ARM NO REPORT    Epidural Steroid injection    methylPREDNISolone acetate (DEPO-MEDROL) injection 80 mg      Plan: No additional findings.   Meds & Orders:  Meds ordered this encounter  Medications   methylPREDNISolone acetate (DEPO-MEDROL) injection 80 mg    Orders Placed This Encounter  Procedures   XR C-ARM NO REPORT   Epidural Steroid injection    Follow-up: Return for visit to requesting provider as needed.   Procedures: No procedures performed  Lumbar Epidural Steroid Injection - Interlaminar Approach with Fluoroscopic Guidance  Patient: Catherine Garza      Date of Birth: [birth year] MRN: [id number] PCP: Claiborne Rigg, NP      Visit Date: 02/07/2023   Universal Protocol:     Consent Given By: the patient  Position: PRONE  Additional Comments: Vital signs were monitored before and after the procedure. Patient was prepped and draped in the usual sterile fashion. The correct patient, procedure, and site was verified.   Injection Procedure Details:   Procedure diagnoses: Lumbar radiculopathy [M54.16]   Meds Administered:  Meds  ordered this encounter  Medications   methylPREDNISolone acetate (DEPO-MEDROL) injection 80 mg     Laterality: Right  Location/Site:  L5-S1  Needle: 3.5 in., 20 ga. Tuohy  Needle Placement: Paramedian epidural  Findings:   -Comments: Excellent flow of contrast into the epidural space.  Procedure Details: Using a paramedian approach from the side mentioned above, the region overlying the inferior lamina was localized under fluoroscopic visualization and the soft tissues overlying this structure were infiltrated with 4 ml. of 1% Lidocaine without Epinephrine. The Tuohy needle was inserted into the epidural space using a paramedian approach.   The epidural space was localized using loss of resistance along with counter oblique bi-planar fluoroscopic views.  After negative aspirate for air, blood, and CSF, a 2 ml. volume of Isovue-250 was injected into the epidural space and the flow of contrast was observed. Radiographs were obtained for documentation purposes.    The injectate was administered into the level noted above.   Additional Comments:  The patient tolerated the procedure well Dressing: 2 x 2 sterile gauze and Band-Aid    Post-procedure details: Patient was observed during the procedure. Post-procedure instructions were reviewed.  Patient left the clinic in stable condition.   Clinical History: Patient name: Catherine Garza [facility]  Referring physician: Leisa Lenz  Radiologist: Emeline Darling, MD  Date of service: 04/10/2019  Exam requested: MRI SPINE LUMBAR  SPINE W/O CONTRAST   Findings:  T12-L1: No disc herniation, central canal, or foraminal stenosis.  L1-2: No disc herniation, central canal, or foraminal stenosis.  L2-3: No disc herniation,  central canal, or foraminal stenosis.  L3-4: No disc herniation, central canal, or foraminal stenosis.  L4-5: No disc herniation, central canal, or foraminal stenosis.  L5-S1: There is a central protrusion-type disc  herniation impinging the ventral thecal sac and contacting the traversing S1 nerve roots (axial 23, sagittal 6 ).  No significant disc height loss is present.  On the STIR images there is an annular tear posteriorly (sagittal STIR image 6).  No significant foraminal stenosis.  No osteophyte formation or degenerative changes.   IMPRESSION:  1. 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  Within a reasonable degree of certainty, the disc herniation appears t traumatic in etiology and associated with the date of injury 02/03/2019 with clinical correlation is recommended     Objective:  VS:  HT:    WT:   BMI:     BP:(!) 140/92  HR:68bpm  TEMP: ( )  RESP:  Physical Exam Vitals and nursing note reviewed.  Constitutional:      General: She is not in acute distress.    Appearance: Normal appearance. She is not ill-appearing.  HENT:     Head: Normocephalic and atraumatic.     Right Ear: External ear normal.     Left Ear: External ear normal.  Eyes:     Extraocular Movements: Extraocular movements intact.  Cardiovascular:     Rate and Rhythm: Normal rate.     Pulses: Normal pulses.  Pulmonary:     Effort: Pulmonary effort is normal. No respiratory distress.  Abdominal:     General: There is no distension.     Palpations: Abdomen is soft.  Musculoskeletal:        General: Tenderness present.     Cervical back: Neck supple.     Right lower leg: No edema.     Left lower leg: No edema.     Comments: Patient has good distal strength with no pain over the greater trochanters.  No clonus or focal weakness.  Skin:    Findings: No erythema, lesion or rash.  Neurological:     General: No focal deficit present.     Mental Status: She is alert and oriented to person, place, and time.     Sensory: No sensory deficit.     Motor: No weakness or abnormal muscle tone.     Coordination:  Coordination normal.  Psychiatric:        Mood and Affect: Mood normal.        Behavior: Behavior normal.      Imaging: No results found.

## 2023-03-14 ENCOUNTER — Other Ambulatory Visit (HOSPITAL_COMMUNITY): Payer: Self-pay

## 2023-03-15 ENCOUNTER — Encounter: Payer: Self-pay | Admitting: Physical Medicine & Rehabilitation

## 2023-03-15 ENCOUNTER — Other Ambulatory Visit (HOSPITAL_COMMUNITY): Payer: Self-pay

## 2023-03-15 ENCOUNTER — Encounter
Payer: Commercial Managed Care - HMO | Attending: Physical Medicine & Rehabilitation | Admitting: Physical Medicine & Rehabilitation

## 2023-03-15 ENCOUNTER — Other Ambulatory Visit: Payer: Self-pay

## 2023-03-15 VITALS — BP 138/90 | HR 82 | Ht 66.5 in | Wt 197.8 lb

## 2023-03-15 DIAGNOSIS — M5441 Lumbago with sciatica, right side: Secondary | ICD-10-CM | POA: Diagnosis not present

## 2023-03-15 DIAGNOSIS — F32A Depression, unspecified: Secondary | ICD-10-CM | POA: Diagnosis not present

## 2023-03-15 DIAGNOSIS — M797 Fibromyalgia: Secondary | ICD-10-CM | POA: Insufficient documentation

## 2023-03-15 DIAGNOSIS — G8929 Other chronic pain: Secondary | ICD-10-CM | POA: Insufficient documentation

## 2023-03-15 DIAGNOSIS — M25562 Pain in left knee: Secondary | ICD-10-CM | POA: Insufficient documentation

## 2023-03-15 MED ORDER — DICLOFENAC SODIUM 1 % EX GEL
4.0000 g | Freq: Four times a day (QID) | CUTANEOUS | 4 refills | Status: DC
  Filled 2023-03-15: qty 200, 13d supply, fill #0
  Filled 2023-03-15: qty 200, fill #0

## 2023-03-15 MED ORDER — IBUPROFEN 800 MG PO TABS
800.0000 mg | ORAL_TABLET | Freq: Every day | ORAL | 0 refills | Status: DC | PRN
  Filled 2023-03-15 (×2): qty 30, 30d supply, fill #0

## 2023-03-15 MED ORDER — IBUPROFEN 800 MG PO TABS: 800.0000 mg | ORAL_TABLET | Freq: Every day | ORAL | 0 refills | Status: DC | PRN

## 2023-03-15 MED ORDER — CYCLOBENZAPRINE HCL 5 MG PO TABS
5.0000 mg | ORAL_TABLET | Freq: Every day | ORAL | 3 refills | Status: DC
  Filled 2023-03-15 (×2): qty 30, 30d supply, fill #0
  Filled 2023-09-19: qty 30, 30d supply, fill #1

## 2023-03-15 MED ORDER — PREGABALIN 50 MG PO CAPS
50.0000 mg | ORAL_CAPSULE | Freq: Three times a day (TID) | ORAL | 5 refills | Status: DC
  Filled 2023-03-15 (×2): qty 90, 30d supply, fill #0
  Filled 2023-05-09: qty 90, 30d supply, fill #1
  Filled 2023-06-26 (×3): qty 90, 30d supply, fill #2
  Filled 2023-08-23: qty 90, 30d supply, fill #3
  Filled 2023-11-26: qty 90, 30d supply, fill #4
  Filled 2024-03-02: qty 90, 30d supply, fill #5
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
## 2023-03-19 ENCOUNTER — Other Ambulatory Visit (HOSPITAL_COMMUNITY): Payer: Self-pay

## 2023-04-16 ENCOUNTER — Ambulatory Visit
Admission: EM | Admit: 2023-04-16 | Discharge: 2023-04-16 | Disposition: A | Discharge status: Home / Self Care | Payer: Commercial Managed Care - HMO | Attending: Nurse Practitioner | Admitting: Nurse Practitioner

## 2023-04-16 ENCOUNTER — Ambulatory Visit: Payer: Self-pay | Admitting: *Deleted

## 2023-04-16 DIAGNOSIS — R399 Unspecified symptoms and signs involving the genitourinary system: Secondary | ICD-10-CM | POA: Insufficient documentation

## 2023-04-16 DIAGNOSIS — N898 Other specified noninflammatory disorders of vagina: Secondary | ICD-10-CM | POA: Diagnosis not present

## 2023-04-16 DIAGNOSIS — Z3202 Encounter for pregnancy test, result negative: Secondary | ICD-10-CM | POA: Diagnosis not present

## 2023-04-16 LAB — POCT URINALYSIS DIP (MANUAL ENTRY)
Bilirubin, UA: NEGATIVE
Glucose, UA: NEGATIVE mg/dL
Ketones, POC UA: NEGATIVE mg/dL
Nitrite, UA: NEGATIVE
Spec Grav, UA: >=1.03 — AB (ref 1.010–1.025)
Urobilinogen, UA: 0.2 E.U./dL
pH, UA: 6 (ref 5.0–8.0)

## 2023-04-16 LAB — POCT URINE PREGNANCY: Preg Test, Ur: NEGATIVE

## 2023-04-16 NOTE — Telephone Encounter (Signed)
Noted  

## 2023-04-16 NOTE — Telephone Encounter (Signed)
  Chief Complaint: urinary vs vaginal odor Symptoms: patient has concerns she has vaginal vs urinary infection- she has odor, slight discharge Frequency: 1 week Pertinent Negatives: Patient denies pain Disposition: [] ED /[x] Urgent Care (no appt availability in office) / [] Appointment(In office/virtual)/ []  [facility]/ [] Home Care/ [] Refused Recommended Disposition /[] [facility]/ []  Follow-up with PCP Additional Notes: No open appointment- advised UC for evaluation since patient is at work and will have to be seen after she gets off.

## 2023-04-16 NOTE — ED Triage Notes (Addendum)
Pt c/o Possible UTI sx's, possible vaginosis, pt states she has issues with her bladder, pt does not experience typical symptoms.   Odor coming from the vagina

## 2023-04-16 NOTE — Telephone Encounter (Signed)
Summary: UTI Concerns with Vaginal Smell.   Pt is calling report a vaginal smell. Patient is concerned about UTI. The soonest appt is 04/23/23. Please advise         Reason for Disposition  [1] Vaginal odor (bad smell) AND [2] not improved > 3 days following Care Advice  Answer Assessment - Initial Assessment Questions 1. SYMPTOM: "What's the main symptom you're concerned about?" (e.g., pain, itching, dryness)     Strong urine odor 2. LOCATION: "Where is the  odor located?" (e.g., inside/outside, left/right)     When has urge to urinate 3. ONSET: "When did the  odor  start?"     1 week 4. PAIN: "Is there any pain?" If Yes, ask: "How bad is it?" (Scale: 1-10; mild, moderate, severe)   -  MILD (1-3): Doesn't interfere with normal activities.    -  MODERATE (4-7): Interferes with normal activities (e.g., work or school) or awakens from sleep.     -  SEVERE (8-10): Excruciating pain, unable to do any normal activities.     no 5. ITCHING: "Is there any itching?" If Yes, ask: "How bad is it?" (Scale: 1-10; mild, moderate, severe)     no 6. CAUSE: "What do you think is causing the discharge?" "Have you had the same problem before? What happened then?"     Patient is not sure if urinary or vaginal 7. OTHER SYMPTOMS: "Do you have any other symptoms?" (e.g., fever, itching, vaginal bleeding, pain with urination, injury to genital area, vaginal foreign body)     Discharge- clear  Protocols used: Vaginal Symptoms-A-AH

## 2023-04-16 NOTE — Discharge Instructions (Addendum)
Urine test today does not show a conclusive urinary tract infection.  We are sending for urine culture and will contact you if this is positive later this week.  We are also testing the vaginal swab for other causes to explain the symptoms.  We will contact you if this is positive later this week.  Recommend increasing water intake to at least 64 ounces daily.  Follow-up with PCP if symptoms persist or worsen despite treatment and all testing is negative.
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
## 2023-04-17 LAB — CERVICOVAGINAL ANCILLARY ONLY
Bacterial Vaginitis (gardnerella): NEGATIVE
Candida Glabrata: NEGATIVE
Candida Vaginitis: NEGATIVE
Chlamydia: NEGATIVE
Comment: NEGATIVE
Comment: NEGATIVE
Comment: NEGATIVE
Comment: NEGATIVE
Comment: NEGATIVE
Comment: NORMAL
Neisseria Gonorrhea: NEGATIVE
Trichomonas: NEGATIVE

## 2023-04-17 LAB — URINE CULTURE: Culture: NO GROWTH

## 2023-05-09 ENCOUNTER — Encounter: Payer: Self-pay | Admitting: Nurse Practitioner

## 2023-05-09 ENCOUNTER — Other Ambulatory Visit (HOSPITAL_COMMUNITY): Payer: Self-pay

## 2023-05-09 ENCOUNTER — Other Ambulatory Visit: Payer: Self-pay

## 2023-05-09 ENCOUNTER — Ambulatory Visit: Payer: Medicaid Other | Attending: Nurse Practitioner | Admitting: Nurse Practitioner

## 2023-05-09 VITALS — BP 117/84 | HR 77 | Ht 67.0 in | Wt 199.2 lb

## 2023-05-09 DIAGNOSIS — Z1231 Encounter for screening mammogram for malignant neoplasm of breast: Secondary | ICD-10-CM

## 2023-05-09 DIAGNOSIS — I10 Essential (primary) hypertension: Secondary | ICD-10-CM

## 2023-05-09 DIAGNOSIS — Z0000 Encounter for general adult medical examination without abnormal findings: Secondary | ICD-10-CM

## 2023-05-09 DIAGNOSIS — N069 Isolated proteinuria with unspecified morphologic lesion: Secondary | ICD-10-CM

## 2023-05-09 MED ORDER — LOSARTAN POTASSIUM 100 MG PO TABS
100.0000 mg | ORAL_TABLET | Freq: Every day | ORAL | 1 refills | Status: DC
Start: 2023-05-09 — End: 2023-11-26
  Filled 2023-05-09 – 2023-06-26 (×4): qty 90, 90d supply, fill #0
  Filled 2023-08-23 – 2023-09-19 (×2): qty 90, 90d supply, fill #1

## 2023-05-09 NOTE — Progress Notes (Signed)
Assessment & Plan:  Catherine Garza was seen today for annual exam.  Diagnoses and all orders for this visit:  Encounter for annual physical exam -     CMP14+EGFR -     CBC with Differential -     Lipid panel  Primary hypertension Continue all antihypertensives as prescribed.  Reminded to bring in blood pressure log for follow  up appointment.  RECOMMENDATIONS: DASH/Mediterranean Diets are healthier choices for HTN.   -     CMP14+EGFR -     losartan (COZAAR) 100 MG tablet; Take 1 tablet (100 mg total) by mouth daily.  Breast cancer screening by mammogram -     MM 3D SCREENING MAMMOGRAM BILATERAL BREAST; Future    Patient has been counseled on age-appropriate routine health concerns for screening and prevention. These are reviewed and up-to-date. Referrals have been placed accordingly. Immunizations are up-to-date or declined.    Subjective:   Chief Complaint  Patient presents with   Annual Exam   HPI Catherine Garza 47 y.o. female presents to office today for annual physical exam.   BP Readings from Last 3 Encounters:  05/09/23 117/84  04/16/23 127/88  03/15/23 (!) 138/90     She was referred to behavioral health by her pain mgmt speciasli Patient has been counseled on age-appropriate routine health concerns for screening and prevention. These are reviewed and up-to-date. Referrals have been placed accordingly. Immunizations are up-to-date or declined.     Mammogram: UTD PAP SMEAR: Overdue. COLON CANCER SCREENING: UTD   Review of Systems  Constitutional:  Negative for fever, malaise/fatigue and weight loss.  HENT: Negative.  Negative for nosebleeds.   Eyes: Negative.  Negative for blurred vision, double vision and photophobia.  Respiratory: Negative.  Negative for cough and shortness of breath.   Cardiovascular: Negative.  Negative for chest pain, palpitations and leg swelling.  Gastrointestinal: Negative.  Negative for heartburn, nausea and vomiting.   Genitourinary: Negative.   Musculoskeletal: Negative.  Negative for myalgias.  Skin: Negative.   Neurological: Negative.  Negative for dizziness, focal weakness, seizures and headaches.  Endo/Heme/Allergies: Negative.   Psychiatric/Behavioral: Negative.  Negative for suicidal ideas.     Past Medical History:  Diagnosis Date   Allergy    Anemia    Depression    Hypertension     Past Surgical History:  Procedure Laterality Date   leep      Family History  Problem Relation Age of Onset   Hypertension Mother    Hypertension Father    Ovarian cancer Maternal Grandmother    Throat cancer Paternal Grandfather        smoker    Colon cancer Neg Hx    Rectal cancer Neg Hx    Stomach cancer Neg Hx     Social History Reviewed with no changes to be made today.   Outpatient Medications Prior to Visit  Medication Sig Dispense Refill   cyclobenzaprine (FLEXERIL) 5 MG tablet Take 1 tablet (5 mg total) by mouth at bedtime. 30 tablet 3   diclofenac Sodium (VOLTAREN ARTHRITIS PAIN) 1 % GEL Apply 4 g topically 4 (four) times daily. 150 g 4   ergocalciferol (VITAMIN D2) 1.25 MG (50000 UT) capsule      ferrous sulfate 325 (65 FE) MG tablet Take 1 tablet (325 mg total) by mouth daily with breakfast. 90 tablet 3   ibuprofen (ADVIL) 800 MG tablet Take 1 tablet (800 mg total) by mouth daily as needed for moderate pain. 30 tablet 0  pregabalin (LYRICA) 50 MG capsule Take 1 capsule (50 mg total) by mouth 3 (three) times daily. 90 capsule 5   losartan (COZAAR) 100 MG tablet Take 1 tablet (100 mg total) by mouth daily. 90 tablet 1   Multiple Vitamins-Minerals (WOMENS MULTI GUMMIES PO) Take by mouth. (Patient not taking: Reported on 05/09/2023)     Facility-Administered Medications Prior to Visit  Medication Dose Route Frequency Provider Last Rate Last Admin   0.9 %  sodium chloride infusion  500 mL Intravenous Once Sherrilyn Rist, MD        Allergies  Allergen Reactions   Prednisone Other  (See Comments)    Caused her to have blood in her stool   Amoxicillin     Other reaction(s): Not available   Latex Hives and Other (See Comments)   Penicillins Hives    Other reaction(s): Not available   Nitrofurantoin Other (See Comments)    Severe pain Other reaction(s): abdominal pain Other reaction(s): abdominal pain       Objective:    BP 117/84 (BP Location: Left Arm, Patient Position: Sitting, Cuff Size: Large)   Pulse 77   Ht 5\' 7"  (1.702 m)   Wt 199 lb 3.2 oz (90.4 kg)   LMP 04/19/2023 (Exact Date)   SpO2 98%   BMI 31.20 kg/m  Wt Readings from Last 3 Encounters:  05/09/23 199 lb 3.2 oz (90.4 kg)  03/15/23 197 lb 12.8 oz (89.7 kg)  02/01/23 190 lb (86.2 kg)    Physical Exam Constitutional:      Appearance: She is well-developed.  HENT:     Head: Normocephalic and atraumatic.     Right Ear: Hearing, tympanic membrane, ear canal and external ear normal.     Left Ear: Hearing, tympanic membrane, ear canal and external ear normal.     Nose: Nose normal.     Right Turbinates: Not enlarged.     Left Turbinates: Not enlarged.     Mouth/Throat:     Lips: Pink.     Mouth: Mucous membranes are moist.     Dentition: No dental tenderness, gingival swelling, dental abscesses or gum lesions.     Pharynx: No oropharyngeal exudate.  Eyes:     General: No scleral icterus.       Right eye: No discharge.     Extraocular Movements: Extraocular movements intact.     Conjunctiva/sclera: Conjunctivae normal.     Pupils: Pupils are equal, round, and reactive to light.  Neck:     Thyroid: No thyromegaly.     Trachea: No tracheal deviation.  Cardiovascular:     Rate and Rhythm: Normal rate and regular rhythm.     Heart sounds: Normal heart sounds. No murmur heard.    No friction rub.  Pulmonary:     Effort: Pulmonary effort is normal. No accessory muscle usage or respiratory distress.     Breath sounds: Normal breath sounds. No decreased breath sounds, wheezing, rhonchi or  rales.  Abdominal:     General: Bowel sounds are normal. There is no distension.     Palpations: Abdomen is soft. There is no mass.     Tenderness: There is no abdominal tenderness. There is no right CVA tenderness, left CVA tenderness, guarding or rebound.     Hernia: No hernia is present.  Musculoskeletal:        General: No tenderness or deformity. Normal range of motion.     Cervical back: Normal range of motion and neck  supple.  Lymphadenopathy:     Cervical: No cervical adenopathy.  Skin:    General: Skin is warm and dry.     Findings: No erythema.  Neurological:     Mental Status: She is alert and oriented to person, place, and time.     Cranial Nerves: No cranial nerve deficit.     Motor: Motor function is intact.     Coordination: Coordination is intact. Coordination normal.     Gait: Gait is intact.     Deep Tendon Reflexes:     Reflex Scores:      Patellar reflexes are 1+ on the right side and 1+ on the left side. Psychiatric:        Attention and Perception: Attention normal.        Mood and Affect: Mood normal.        Speech: Speech normal.        Behavior: Behavior normal.        Thought Content: Thought content normal.        Judgment: Judgment normal.          Patient has been counseled extensively about nutrition and exercise as well as the importance of adherence with medications and regular follow-up. The patient was given clear instructions to go to ER or return to medical center if symptoms don't improve, worsen or new problems develop. The patient verbalized understanding.   Follow-up: Return in about 3 months (around 08/09/2023) for HTN.   Claiborne Rigg, FNP-BC [facility] and [facility] [place], [place] [phone]   05/09/2023, 10:43 AM

## 2023-05-10 LAB — CMP14+EGFR
ALT: 15 IU/L (ref 0–32)
AST: 14 IU/L (ref 0–40)
Albumin: 4.7 g/dL (ref 3.9–4.9)
Alkaline Phosphatase: 97 IU/L (ref 44–121)
BUN/Creatinine Ratio: 10 (ref 9–23)
BUN: 8 mg/dL (ref 6–24)
Bilirubin Total: 0.3 mg/dL (ref 0.0–1.2)
CO2: 22 mmol/L (ref 20–29)
Calcium: 9.5 mg/dL (ref 8.7–10.2)
Chloride: 103 mmol/L (ref 96–106)
Creatinine, Ser: 0.79 mg/dL (ref 0.57–1.00)
Globulin, Total: 2.7 g/dL (ref 1.5–4.5)
Glucose: 98 mg/dL (ref 70–99)
Potassium: 4 mmol/L (ref 3.5–5.2)
Sodium: 138 mmol/L (ref 134–144)
Total Protein: 7.4 g/dL (ref 6.0–8.5)
eGFR: 93 mL/min/{1.73_m2} (ref >59)

## 2023-05-10 LAB — URINALYSIS, COMPLETE
Bilirubin, UA: NEGATIVE
Glucose, UA: NEGATIVE
Ketones, UA: NEGATIVE
Nitrite, UA: NEGATIVE
Protein,UA: NEGATIVE
RBC, UA: NEGATIVE
Specific Gravity, UA: 1.019 (ref 1.005–1.030)
Urobilinogen, Ur: 0.2 mg/dL (ref 0.2–1.0)
pH, UA: 5.5 (ref 5.0–7.5)

## 2023-05-10 LAB — MICROSCOPIC EXAMINATION
Bacteria, UA: NONE SEEN
Casts: NONE SEEN /LPF

## 2023-05-10 LAB — CBC WITH DIFFERENTIAL/PLATELET
Basophils Absolute: 0.1 10*3/uL (ref 0.0–0.2)
Basos: 1 %
EOS (ABSOLUTE): 0.1 10*3/uL (ref 0.0–0.4)
Eos: 1 %
Hematocrit: 39.8 % (ref 34.0–46.6)
Hemoglobin: 13.5 g/dL (ref 11.1–15.9)
Immature Grans (Abs): 0 10*3/uL (ref 0.0–0.1)
Immature Granulocytes: 0 %
Lymphocytes Absolute: 2.6 10*3/uL (ref 0.7–3.1)
Lymphs: 29 %
MCH: 29 pg (ref 26.6–33.0)
MCHC: 33.9 g/dL (ref 31.5–35.7)
MCV: 85 fL (ref 79–97)
Monocytes Absolute: 0.5 10*3/uL (ref 0.1–0.9)
Monocytes: 5 %
Neutrophils Absolute: 5.7 10*3/uL (ref 1.4–7.0)
Neutrophils: 64 %
Platelets: 304 10*3/uL (ref 150–450)
RBC: 4.66 x10E6/uL (ref 3.77–5.28)
RDW: 12.6 % (ref 11.7–15.4)
WBC: 9 10*3/uL (ref 3.4–10.8)

## 2023-05-10 LAB — LIPID PANEL
Chol/HDL Ratio: 3.8 ratio (ref 0.0–4.4)
Cholesterol, Total: 241 mg/dL — ABNORMAL HIGH (ref 100–199)
HDL: 64 mg/dL (ref >39)
LDL Chol Calc (NIH): 166 mg/dL — ABNORMAL HIGH (ref 0–99)
Triglycerides: 64 mg/dL (ref 0–149)
VLDL Cholesterol Cal: 11 mg/dL (ref 5–40)

## 2023-05-11 ENCOUNTER — Encounter: Payer: Commercial Managed Care - HMO | Admitting: Physical Medicine & Rehabilitation

## 2023-06-13 ENCOUNTER — Telehealth: Payer: Self-pay

## 2023-06-13 NOTE — Telephone Encounter (Signed)
Copied from CRM [id number]. Topic: General - Other >> Jun 13, 2023 12:10 PM Dondra Prader E wrote: Reason for CRM: Pt called reporting that the imaging center is requesting a referral for a diagnostic mammogram and not just a regular mammogram since she is experiencing pain and they could see her sooner with this referral.

## 2023-06-14 NOTE — Telephone Encounter (Signed)
Return call unanswered by patient. Voicemail left to return call to our office.

## 2023-06-14 NOTE — Telephone Encounter (Signed)
She did not mention any breast pain to me in August at her appointment. When did her breast pain start and in which breast

## 2023-06-19 NOTE — Telephone Encounter (Signed)
Patient stated that she would call our office back after she had gotten off of work.

## 2023-06-20 ENCOUNTER — Other Ambulatory Visit: Payer: Self-pay | Admitting: Nurse Practitioner

## 2023-06-20 DIAGNOSIS — N644 Mastodynia: Secondary | ICD-10-CM

## 2023-06-20 NOTE — Telephone Encounter (Signed)
Pt returning call

## 2023-06-20 NOTE — Telephone Encounter (Signed)
Patient stated that  both of her breast are causing her pain and she does not remember when the pain started. Patient also stated that  the pain could possibly come from the bras she wears or the vitamin d deficiency

## 2023-06-20 NOTE — Telephone Encounter (Signed)
Patient identified by name and date of birth.  Patient aware of response.

## 2023-06-20 NOTE — Telephone Encounter (Signed)
Order has been changed

## 2023-06-20 NOTE — Telephone Encounter (Signed)
Pt returned call

## 2023-06-26 ENCOUNTER — Other Ambulatory Visit: Payer: Self-pay

## 2023-06-26 ENCOUNTER — Other Ambulatory Visit (HOSPITAL_COMMUNITY): Payer: Self-pay

## 2023-07-11 ENCOUNTER — Ambulatory Visit: Payer: Medicaid Other | Attending: Nurse Practitioner | Admitting: Nurse Practitioner

## 2023-07-11 DIAGNOSIS — Z124 Encounter for screening for malignant neoplasm of cervix: Secondary | ICD-10-CM

## 2023-07-11 NOTE — Progress Notes (Signed)
Catherine Garza states she will have her pap smear done at her upcoming GYN appt. Does not need appt today

## 2023-07-30 ENCOUNTER — Other Ambulatory Visit: Payer: Self-pay | Admitting: Nurse Practitioner

## 2023-07-30 DIAGNOSIS — N644 Mastodynia: Secondary | ICD-10-CM

## 2023-08-07 ENCOUNTER — Other Ambulatory Visit (HOSPITAL_COMMUNITY)
Admission: RE | Admit: 2023-08-07 | Discharge: 2023-08-07 | Disposition: A | Discharge status: Home / Self Care | Payer: Medicaid Other | Source: Ambulatory Visit | Attending: Advanced Practice Midwife | Admitting: Advanced Practice Midwife

## 2023-08-07 ENCOUNTER — Ambulatory Visit: Payer: Medicaid Other | Admitting: Advanced Practice Midwife

## 2023-08-07 ENCOUNTER — Encounter: Payer: Self-pay | Admitting: Advanced Practice Midwife

## 2023-08-07 VITALS — BP 123/78 | HR 75 | Ht 66.5 in | Wt 200.8 lb

## 2023-08-07 DIAGNOSIS — H579 Unspecified disorder of eye and adnexa: Secondary | ICD-10-CM

## 2023-08-07 DIAGNOSIS — Z01419 Encounter for gynecological examination (general) (routine) without abnormal findings: Secondary | ICD-10-CM | POA: Diagnosis not present

## 2023-08-07 DIAGNOSIS — Z30430 Encounter for insertion of intrauterine contraceptive device: Secondary | ICD-10-CM | POA: Diagnosis not present

## 2023-08-07 DIAGNOSIS — L750 Bromhidrosis: Secondary | ICD-10-CM | POA: Diagnosis not present

## 2023-08-07 DIAGNOSIS — N3946 Mixed incontinence: Secondary | ICD-10-CM | POA: Diagnosis not present

## 2023-08-07 DIAGNOSIS — I10 Essential (primary) hypertension: Secondary | ICD-10-CM | POA: Diagnosis not present

## 2023-08-07 NOTE — Progress Notes (Signed)
Subjective:     Catherine Garza is a 47 y.o. female here at [facility] for a routine exam.  Current complaints: leaking urine, even with limited stress or urge, started a couple of years ago, worsening. Pt is on a diuretic for HTN but the medicine has not changed in several years.  She reports a urinary odor and vaginal irritation/odor, but testing with her PCP for infection have been negative.  She also reports changes in color in a ring around her irises and read that this can be a sign of high cholesterol. She had a lipid panel with her PCP in the last 6 months that was a little elevated but no medications were initiated.  Her husband was out of the country and visited 1-2 times per year but is now here full time so she wants to start something for birth control. She had a Mirena IUD in the past but had it taken out because it was due to be replaced and her urinary symptoms started so she thought the IUD might be the cause.  Personal and family health history reviewed: yes.  Do you have a primary care provider? yes Do you feel safe at home? yes  [employer] Visit from 05/09/2023 in [facility] [place]. [facility]  PHQ-2 Total Score 4       Health Maintenance Due  Topic Date Due   INFLUENZA VACCINE  Never done   COVID-19 Vaccine (1 - 2023-24 season) Never done     Risk factors for chronic health problems: Smoking: Alchohol/how much: Pt BMI: Body mass index is 31.92 kg/m.   Gynecologic History Patient's last menstrual period was 07/22/2023. Contraception: none Last Pap: 04/02/20. Results were: normal Last mammogram: 07/26/22. Results were: normal  Obstetric History OB History  Gravida Para Term Preterm AB Living  2 1 1   1 1   SAB IAB Ectopic Multiple Live Births  1            # Outcome Date GA Lbr Len/2nd Weight Sex Type Anes PTL Lv  2 Term 11/14/14 [redacted]w[redacted]d    Vag-Spont     1 SAB              The following portions  of the patient's history were reviewed and updated as appropriate: allergies, current medications, past family history, past medical history, past social history, past surgical history, and problem list.  Review of Systems Pertinent items noted in HPI and remainder of comprehensive ROS otherwise negative.    Objective:  BP 123/78   Pulse 75   Ht 5' 6.5" (1.689 m)   Wt 200 lb 12.8 oz (91.1 kg)   LMP 07/22/2023   BMI 31.92 kg/m   VS reviewed, nursing note reviewed,  Constitutional: well developed, well nourished, no distress HEENT: normocephalic, thyroid without enlargement or mass HEART: RRR, no murmurs rubs/gallops RESP: clear and equal to auscultation bilaterally in all lobes  Breast Exam: exam performed: right breast normal without mass, skin or nipple changes or axillary nodes, left breast normal without mass, skin or nipple changes or axillary nodes Abdomen: soft Neuro: alert and oriented x 3 Skin: warm, dry Psych: affect normal Pelvic exam:Performed: Cervix pink, visually closed, without lesion, scant white creamy discharge, vaginal walls and external genitalia normal Bimanual exam: Cervix 0/long/high, firm, anterior, neg CMT, uterus nontender, nonenlarged, adnexa without tenderness, enlargement, or mass      IUD Procedure Note Patient identified,  informed consent performed.  Discussed risks of irregular bleeding, cramping, infection, malpositioning or misplacement of the IUD outside the uterus which may require further procedures. Time out was performed.  Urine pregnancy test negative.  Speculum placed in the vagina.  Cervix visualized.  Cleaned with Betadine x 2.  Grasped anteriorly with a single tooth tenaculum.  Uterus sounded to 6 cm.  Mirena IUD placed per manufacturer's recommendations.  Strings trimmed to 3 cm. Tenaculum was removed, good hemostasis noted.  Patient tolerated procedure well.   Patient was given post-procedure instructions and the Mirena care card with  expiration date.  Patient was also asked to check IUD strings periodically and follow up in 4-6 weeks for IUD check.     Assessment/Plan:   1. Encounter for annual routine gynecological examination  - Cytology - PAP( [place])  2. Essential hypertension --Taking medications as prescribed, including diuretic for HTN per pt (not listed in current med list), reports taking diuretic for ~ 8 years  3. Mixed stress and urge urinary incontinence --Worsening, pt wearing pad all the time --Pt to f/u with PCP about HTN management without diuretic  - AMB referral to rehabilitation - Ambulatory referral to Urogynecology  4. Urinary body odor --Urine culture and vaginal swab today  - Cervicovaginal ancillary only( [place])  5. Bilateral eye symptoms --Pt to f/u with optometrist and PCP       Return in about 4 weeks (around 09/04/2023) for string check.   Sharen Counter, CNM 6:45 PM

## 2023-08-07 NOTE — Progress Notes (Signed)
Pt presents for AEX.  Last PAP 05/2020, mammogram scheduled, colonoscopy 2023 Pt requesting [facility] consult, interested in pills (Opill) or Mirena  Declines STD testing  Pt c/o incontinence episodes

## 2023-08-08 MED ORDER — LEVONORGESTREL 20 MCG/DAY IU IUD
1.0000 | INTRAUTERINE_SYSTEM | Freq: Once | INTRAUTERINE | Status: AC
  Administered 2023-08-07: 1 via INTRAUTERINE

## 2023-08-08 NOTE — Addendum Note (Signed)
Addended by: Jearld Adjutant on: 08/08/2023 04:03 PM   Modules accepted: Orders

## 2023-08-09 LAB — CERVICOVAGINAL ANCILLARY ONLY
Bacterial Vaginitis (gardnerella): NEGATIVE
Candida Glabrata: NEGATIVE
Candida Vaginitis: NEGATIVE
Chlamydia: NEGATIVE
Comment: NEGATIVE
Comment: NEGATIVE
Comment: NEGATIVE
Comment: NEGATIVE
Comment: NEGATIVE
Comment: NORMAL
Neisseria Gonorrhea: NEGATIVE
Trichomonas: NEGATIVE

## 2023-08-10 LAB — CYTOLOGY - PAP
Comment: NEGATIVE
Diagnosis: NEGATIVE
High risk HPV: NEGATIVE

## 2023-08-12 ENCOUNTER — Encounter: Payer: Self-pay | Admitting: Nurse Practitioner

## 2023-08-14 ENCOUNTER — Ambulatory Visit: Payer: Medicaid Other | Admitting: Nurse Practitioner

## 2023-08-23 ENCOUNTER — Other Ambulatory Visit: Payer: Self-pay | Admitting: Physical Medicine & Rehabilitation

## 2023-08-24 ENCOUNTER — Other Ambulatory Visit (HOSPITAL_COMMUNITY): Payer: Self-pay

## 2023-08-24 ENCOUNTER — Other Ambulatory Visit: Payer: Self-pay

## 2023-09-06 ENCOUNTER — Ambulatory Visit
Admission: RE | Admit: 2023-09-06 | Discharge: 2023-09-06 | Disposition: A | Discharge status: Home / Self Care | Payer: Medicaid Other | Source: Ambulatory Visit | Attending: Nurse Practitioner | Admitting: Nurse Practitioner

## 2023-09-06 ENCOUNTER — Ambulatory Visit: Payer: Medicaid Other

## 2023-09-06 DIAGNOSIS — N644 Mastodynia: Secondary | ICD-10-CM

## 2023-09-09 ENCOUNTER — Encounter: Payer: Self-pay | Admitting: Nurse Practitioner

## 2023-09-12 ENCOUNTER — Encounter: Payer: Self-pay | Admitting: Obstetrics and Gynecology

## 2023-09-12 ENCOUNTER — Ambulatory Visit: Payer: Medicaid Other | Admitting: Obstetrics and Gynecology

## 2023-09-12 VITALS — BP 138/86 | HR 82 | Ht 66.5 in | Wt 197.0 lb

## 2023-09-12 DIAGNOSIS — Z30431 Encounter for routine checking of intrauterine contraceptive device: Secondary | ICD-10-CM

## 2023-09-12 NOTE — Progress Notes (Signed)
47 y.o. GYN presents for String Check.  C/o bleeding heavily since insertion in November.

## 2023-09-12 NOTE — Progress Notes (Signed)
47 yo P1 here for IUD check. Patient had IUD inserted November 2024. She denies any discomfort with intercourse. She admits to vaginal bleeding since insertion. She is without any other complaints  Past Medical History:  Diagnosis Date   Allergy    Anemia    Depression    Fibromyalgia    Hypertension    Past Surgical History:  Procedure Laterality Date   leep     Family History  Problem Relation Age of Onset   Hypertension Mother    Hypertension Father    Ovarian cancer Maternal Grandmother    Throat cancer Paternal Grandfather        smoker    Colon cancer Neg Hx    Rectal cancer Neg Hx    Stomach cancer Neg Hx    Social History   Tobacco Use   Smoking status: Never   Smokeless tobacco: Never  Vaping Use   Vaping status: Never Used  Substance Use Topics   Alcohol use: Not Currently    Comment: occ   Drug use: Never   ROS See pertinent in HPI. All other systems reviewed and non contributory Blood pressure 138/86, pulse 82, height 5' 6.5" (1.689 m), weight 197 lb (89.4 kg). GENERAL: Well-developed, well-nourished female in no acute distress.  ABDOMEN: Soft, nontender, nondistended. No organomegaly. PELVIC: Normal external female genitalia. Vagina is pink and rugated.  Normal discharge. Normal appearing cervix with IUD strings visualized at the os, extending 2 cm and curled around the cervix. Uterus is normal in size. No adnexal mass or tenderness. Chaperone present during the pelvic exam EXTREMITIES: No cyanosis, clubbing, or edema, 2+ distal pulses.  A/P 47 yo here for IUD check up - IUD appears to be in the appropriate location - Informed patient that irregular bleeding can be present 3-6 months following insertion - Patient current on pap smear, colonoscopy and mammography

## 2023-09-19 ENCOUNTER — Other Ambulatory Visit: Payer: Self-pay | Admitting: Physical Medicine & Rehabilitation

## 2023-09-19 ENCOUNTER — Other Ambulatory Visit (HOSPITAL_COMMUNITY): Payer: Self-pay

## 2023-09-20 ENCOUNTER — Other Ambulatory Visit: Payer: Self-pay

## 2023-10-01 DIAGNOSIS — H5213 Myopia, bilateral: Secondary | ICD-10-CM | POA: Diagnosis not present

## 2023-10-08 ENCOUNTER — Ambulatory Visit: Payer: Medicaid Other | Admitting: Nurse Practitioner

## 2023-11-26 ENCOUNTER — Other Ambulatory Visit: Payer: Self-pay | Admitting: Nurse Practitioner

## 2023-11-26 ENCOUNTER — Other Ambulatory Visit (HOSPITAL_COMMUNITY): Payer: Self-pay

## 2023-11-26 ENCOUNTER — Other Ambulatory Visit: Payer: Self-pay | Admitting: Physical Medicine & Rehabilitation

## 2023-11-26 DIAGNOSIS — I10 Essential (primary) hypertension: Secondary | ICD-10-CM

## 2023-11-26 MED ORDER — LOSARTAN POTASSIUM 100 MG PO TABS
100.0000 mg | ORAL_TABLET | Freq: Every day | ORAL | 0 refills | Status: AC
  Filled 2023-11-26: qty 30, 30d supply, fill #0

## 2023-11-27 ENCOUNTER — Other Ambulatory Visit (HOSPITAL_COMMUNITY): Payer: Self-pay

## 2023-11-27 ENCOUNTER — Other Ambulatory Visit: Payer: Self-pay

## 2023-11-27 MED ORDER — IBUPROFEN 800 MG PO TABS
800.0000 mg | ORAL_TABLET | Freq: Every day | ORAL | 0 refills | Status: DC | PRN
  Filled 2023-11-27: qty 30, 30d supply, fill #0

## 2023-11-28 ENCOUNTER — Other Ambulatory Visit: Payer: Self-pay

## 2023-11-28 ENCOUNTER — Other Ambulatory Visit (HOSPITAL_COMMUNITY): Payer: Self-pay

## 2023-12-19 ENCOUNTER — Ambulatory Visit: Payer: Medicaid Other | Admitting: Obstetrics

## 2023-12-24 ENCOUNTER — Encounter: Payer: Self-pay | Admitting: Nurse Practitioner

## 2023-12-26 ENCOUNTER — Ambulatory Visit: Attending: Nurse Practitioner | Admitting: Nurse Practitioner

## 2023-12-26 DIAGNOSIS — L989 Disorder of the skin and subcutaneous tissue, unspecified: Secondary | ICD-10-CM

## 2023-12-26 NOTE — Telephone Encounter (Signed)
 Voicemail left to return call to schedule virtual visit.

## 2023-12-26 NOTE — Telephone Encounter (Signed)
Needs a virtual visit.

## 2023-12-26 NOTE — Telephone Encounter (Signed)
 Noted.

## 2023-12-26 NOTE — Progress Notes (Signed)
 [name] never logged into video even after being sent a link to email and text   Mobile Phone Send Link Last text sent3:26 PM To:[phone]   Send Via Send Link Last email sent3:30 PM YN:[email or web address]

## 2023-12-28 ENCOUNTER — Other Ambulatory Visit (HOSPITAL_COMMUNITY): Payer: Self-pay

## 2024-01-16 ENCOUNTER — Ambulatory Visit: Attending: Nurse Practitioner | Admitting: Nurse Practitioner

## 2024-01-16 ENCOUNTER — Other Ambulatory Visit (HOSPITAL_COMMUNITY)
Admission: RE | Admit: 2024-01-16 | Discharge: 2024-01-16 | Disposition: A | Discharge status: Home / Self Care | Source: Ambulatory Visit | Attending: Nurse Practitioner | Admitting: Nurse Practitioner

## 2024-01-16 ENCOUNTER — Encounter: Payer: Self-pay | Admitting: Nurse Practitioner

## 2024-01-16 VITALS — BP 138/89 | HR 85 | Resp 20 | Ht 66.5 in | Wt 194.0 lb

## 2024-01-16 DIAGNOSIS — R8290 Unspecified abnormal findings in urine: Secondary | ICD-10-CM | POA: Diagnosis not present

## 2024-01-16 DIAGNOSIS — F419 Anxiety disorder, unspecified: Secondary | ICD-10-CM

## 2024-01-16 DIAGNOSIS — F32A Depression, unspecified: Secondary | ICD-10-CM

## 2024-01-16 DIAGNOSIS — L299 Pruritus, unspecified: Secondary | ICD-10-CM | POA: Diagnosis not present

## 2024-01-16 DIAGNOSIS — K1370 Unspecified lesions of oral mucosa: Secondary | ICD-10-CM

## 2024-01-16 NOTE — Progress Notes (Signed)
 Assessment & Plan:  [name] was seen today for oral pain.  Diagnoses and all orders for this visit:  Pruritic condition -     Fungus culture, blood  Malodorous urine -     Cervicovaginal ancillary only -     Urinalysis, Complete  Oral lesion -     Ambulatory referral to ENT  Anxiety and depression -     Ambulatory referral to Behavioral Health    Patient has been counseled on age-appropriate routine health concerns for screening and prevention. These are reviewed and up-to-date. Referrals have been placed accordingly. Immunizations are up-to-date or declined.    Subjective:   Chief Complaint  Patient presents with   Oral Pain    Lump at the roof of mouth.     Catherine Garza 48 y.o. female presents to office today regarding anxiety and depression, oral lesion and UTI symptoms.    She is requesting to be tested for a fungal infection due to persistent generalized pruritus. I have explained to her that this may or may not be covered by insurance as there are no currently visible skin conditions (tinea) warranting a fungal test.    Notes recurrent symptoms of malodorous urine or malodorous vaginal discharge. She is unsure where the odor is stemming from.    She has  a lesion on the roof of her mouth. Can not recall exactly how long it has been there. States it could have come from hot food she ate at [employer]   Requesting referral to behavioral health for anxiety.     01/16/2024    4:12 PM 08/08/2023    4:02 PM 05/09/2023   10:55 AM  Depression screen PHQ 2/9  Decreased Interest 2 0 2  Down, Depressed, Hopeless  0 2  PHQ - 2 Score 2 0 4  Altered sleeping 2 3 3   Tired, decreased energy 1 3 3   Change in appetite 0 2 3  Feeling bad or failure about yourself  0 2 3  Trouble concentrating 0 2 0  Moving slowly or fidgety/restless 0 0 0  Suicidal thoughts 0 0 0  PHQ-9 Score 5 12 16   Difficult doing work/chores Somewhat difficult         01/16/2024     4:12 PM 08/08/2023    4:02 PM 05/09/2023   10:56 AM 11/08/2022   10:49 AM  GAD 7 : Generalized Anxiety Score  Nervous, Anxious, on Edge 1 1 3  0  Control/stop worrying 1 0 3 3  Worry too much - different things 1 0 3 1  Trouble relaxing 1 1 3 3   Restless 0 0 0 0  Easily annoyed or irritable 1 1 3 1   Afraid - awful might happen 1 1 3  0  Total GAD 7 Score 6 4 18 8   Anxiety Difficulty Somewhat difficult        Review of Systems  Constitutional:  Negative for fever, malaise/fatigue and weight loss.  HENT:  Negative for nosebleeds.        SEE HPI  Eyes: Negative.  Negative for blurred vision, double vision and photophobia.  Respiratory: Negative.  Negative for cough and shortness of breath.   Cardiovascular: Negative.  Negative for chest pain, palpitations and leg swelling.  Gastrointestinal: Negative.  Negative for heartburn, nausea and vomiting.  Musculoskeletal: Negative.  Negative for myalgias.  Skin:  Positive for itching. Negative for rash.  Neurological: Negative.  Negative for dizziness, focal weakness, seizures and headaches.  Psychiatric/Behavioral:  Positive for depression. Negative for suicidal ideas. The patient is nervous/anxious.     Past Medical History:  Diagnosis Date   Allergy    Anemia    Depression    Fibromyalgia    Hypertension     Past Surgical History:  Procedure Laterality Date   leep      Family History  Problem Relation Age of Onset   Hypertension Mother    Hypertension Father    Ovarian cancer Maternal Grandmother    Throat cancer Paternal Grandfather        smoker    Colon cancer Neg Hx    Rectal cancer Neg Hx    Stomach cancer Neg Hx     Social History Reviewed with no changes to be made today.   Outpatient Medications Prior to Visit  Medication Sig Dispense Refill   cyclobenzaprine  (FLEXERIL ) 5 MG tablet Take 1 tablet (5 mg total) by mouth at bedtime. 30 tablet 3   ferrous sulfate  325 (65 FE) MG tablet Take 1 tablet (325 mg total)  by mouth daily with breakfast. 90 tablet 3   ibuprofen  (ADVIL ) 800 MG tablet Take 1 tablet (800 mg total) by mouth daily as needed for moderate pain (pain score 4-6). 30 tablet 0   losartan  (COZAAR ) 100 MG tablet Take 1 tablet (100 mg total) by mouth daily. 30 tablet 0   Multiple Vitamins-Minerals (WOMENS MULTI GUMMIES PO) Take by mouth.     pregabalin  (LYRICA ) 50 MG capsule Take 1 capsule (50 mg total) by mouth 3 (three) times daily. 90 capsule 5   diclofenac  Sodium (VOLTAREN  ARTHRITIS PAIN) 1 % GEL Apply 4 g topically 4 (four) times daily. (Patient not taking: Reported on 01/16/2024) 150 g 4   ergocalciferol  (VITAMIN D2) 1.25 MG (50000 UT) capsule  (Patient not taking: Reported on 01/16/2024)     No facility-administered medications prior to visit.    Allergies  Allergen Reactions   Prednisone  Other (See Comments)    Caused her to have blood in her stool   Amoxicillin     Other reaction(s): Not available   Latex Hives and Other (See Comments)   Penicillins Hives    Other reaction(s): Not available   Nitrofurantoin Other (See Comments)    Severe pain Other reaction(s): abdominal pain Other reaction(s): abdominal pain       Objective:    BP 138/89 (BP Location: Left Arm, Patient Position: Sitting, Cuff Size: Normal)   Pulse 85   Resp 20   Ht 5' 6.5" (1.689 m)   Wt 194 lb (88 kg)   SpO2 100%   BMI 30.84 kg/m  Wt Readings from Last 3 Encounters:  01/16/24 194 lb (88 kg)  09/12/23 197 lb (89.4 kg)  08/07/23 200 lb 12.8 oz (91.1 kg)    Physical Exam Vitals and nursing note reviewed.  Constitutional:      Appearance: She is well-developed.  HENT:     Head: Normocephalic and atraumatic.  Cardiovascular:     Rate and Rhythm: Normal rate and regular rhythm.     Heart sounds: Normal heart sounds. No murmur heard.    No friction rub. No gallop.  Pulmonary:     Effort: Pulmonary effort is normal. No tachypnea or respiratory distress.     Breath sounds: Normal breath sounds.  No decreased breath sounds, wheezing, rhonchi or rales.  Chest:     Chest wall: No tenderness.  Abdominal:     General: Bowel sounds are normal.  Palpations: Abdomen is soft.  Musculoskeletal:        General: Normal range of motion.     Cervical back: Normal range of motion.  Skin:    General: Skin is warm and dry.  Neurological:     Mental Status: She is alert and oriented to person, place, and time.     Coordination: Coordination normal.  Psychiatric:        Behavior: Behavior normal. Behavior is cooperative.        Thought Content: Thought content normal.        Judgment: Judgment normal.          Patient has been counseled extensively about nutrition and exercise as well as the importance of adherence with medications and regular follow-up. The patient was given clear instructions to go to ER or return to medical center if symptoms don't improve, worsen or new problems develop. The patient verbalized understanding.   Follow-up: No follow-ups on file.   Collins Dean, FNP-BC [facility] and [facility] [place], [place] [phone]   01/16/2024, 4:47 PM

## 2024-01-17 ENCOUNTER — Encounter: Payer: Self-pay | Admitting: Nurse Practitioner

## 2024-01-17 LAB — URINALYSIS, COMPLETE
Bilirubin, UA: NEGATIVE
Glucose, UA: NEGATIVE
Ketones, UA: NEGATIVE
Nitrite, UA: NEGATIVE
Protein,UA: NEGATIVE
RBC, UA: NEGATIVE
Specific Gravity, UA: 1.011 (ref 1.005–1.030)
Urobilinogen, Ur: 0.2 mg/dL (ref 0.2–1.0)
pH, UA: 6 (ref 5.0–7.5)

## 2024-01-17 LAB — MICROSCOPIC EXAMINATION
Casts: NONE SEEN /LPF
RBC, Urine: NONE SEEN /HPF (ref 0–2)

## 2024-01-18 ENCOUNTER — Encounter: Payer: Self-pay | Admitting: Nurse Practitioner

## 2024-01-18 LAB — CERVICOVAGINAL ANCILLARY ONLY
Bacterial Vaginitis (gardnerella): NEGATIVE
Candida Glabrata: NEGATIVE
Candida Vaginitis: NEGATIVE
Chlamydia: NEGATIVE
Comment: NEGATIVE
Comment: NEGATIVE
Comment: NEGATIVE
Comment: NEGATIVE
Comment: NEGATIVE
Comment: NORMAL
Neisseria Gonorrhea: NEGATIVE
Trichomonas: NEGATIVE

## 2024-02-19 ENCOUNTER — Ambulatory Visit: Payer: Self-pay | Admitting: Nurse Practitioner

## 2024-02-19 LAB — FUNGUS CULTURE, BLOOD

## 2024-02-20 ENCOUNTER — Other Ambulatory Visit: Payer: Self-pay | Admitting: Nurse Practitioner

## 2024-02-20 DIAGNOSIS — L299 Pruritus, unspecified: Secondary | ICD-10-CM

## 2024-02-25 DIAGNOSIS — R208 Other disturbances of skin sensation: Secondary | ICD-10-CM | POA: Diagnosis not present

## 2024-02-27 ENCOUNTER — Telehealth: Payer: Self-pay | Admitting: Physical Medicine and Rehabilitation

## 2024-02-27 NOTE — Telephone Encounter (Signed)
 Patient called to get another injection in the lower back. [phone]

## 2024-02-28 ENCOUNTER — Other Ambulatory Visit: Payer: Self-pay | Admitting: Physical Medicine and Rehabilitation

## 2024-02-28 DIAGNOSIS — M5416 Radiculopathy, lumbar region: Secondary | ICD-10-CM

## 2024-03-03 ENCOUNTER — Other Ambulatory Visit: Payer: Self-pay

## 2024-03-26 ENCOUNTER — Ambulatory Visit: Admitting: Physical Medicine and Rehabilitation

## 2024-03-26 ENCOUNTER — Ambulatory Visit (HOSPITAL_COMMUNITY): Admitting: Mental Health

## 2024-03-26 ENCOUNTER — Other Ambulatory Visit: Payer: Self-pay

## 2024-03-26 VITALS — BP 137/91 | HR 91

## 2024-03-26 DIAGNOSIS — M5416 Radiculopathy, lumbar region: Secondary | ICD-10-CM | POA: Diagnosis not present

## 2024-03-26 MED ORDER — METHYLPREDNISOLONE ACETATE 40 MG/ML IJ SUSP
40.0000 mg | Freq: Once | INTRAMUSCULAR | Status: AC
  Administered 2024-03-26: 40 mg

## 2024-03-26 NOTE — Progress Notes (Signed)
 Pain Scale   Average Pain 3 Patient advising her lower back pain radiates to her right leg at times when walking. Patient advising that she gets relief from massage or ice.        +Driver, -BT, -Dye Allergies.

## 2024-03-26 NOTE — Procedures (Signed)
 Lumbar Epidural Steroid Injection - Interlaminar Approach with Fluoroscopic Guidance  Patient: Catherine Garza      Date of Birth: [birth year] MRN: [id number] PCP: Theotis Haze ORN, NP      Visit Date: 03/26/2024   Universal Protocol:     Consent Given By: the patient  Position: PRONE  Additional Comments: Vital signs were monitored before and after the procedure. Patient was prepped and draped in the usual sterile fashion. The correct patient, procedure, and site was verified.   Injection Procedure Details:   Procedure diagnoses: Lumbar radiculopathy [M54.16]   Meds Administered:  Meds ordered this encounter  Medications   methylPREDNISolone  acetate (DEPO-MEDROL ) injection 40 mg     Laterality: Midline  Location/Site:  L5-S1  Needle: 4.5 in., 20 ga. Tuohy  Needle Placement: Paramedian epidural  Findings:   -Comments: Excellent flow of contrast into the epidural space.  Procedure Details: Using a paramedian approach from the side mentioned above, the region overlying the inferior lamina was localized under fluoroscopic visualization and the soft tissues overlying this structure were infiltrated with 4 ml. of 1% Lidocaine without Epinephrine. The Tuohy needle was inserted into the epidural space using a paramedian approach.   The epidural space was localized using loss of resistance along with counter oblique bi-planar fluoroscopic views.  After negative aspirate for air, blood, and CSF, a 2 ml. volume of Isovue-250 was injected into the epidural space and the flow of contrast was observed. Radiographs were obtained for documentation purposes.    The injectate was administered into the level noted above.   Additional Comments:  The patient tolerated the procedure well Dressing: 2 x 2 sterile gauze and Band-Aid    Post-procedure details: Patient was observed during the procedure. Post-procedure instructions were reviewed.  Patient left the clinic in stable  condition.

## 2024-03-26 NOTE — Patient Instructions (Signed)

## 2024-04-02 ENCOUNTER — Ambulatory Visit (INDEPENDENT_AMBULATORY_CARE_PROVIDER_SITE_OTHER): Admitting: Otolaryngology

## 2024-04-02 ENCOUNTER — Encounter (INDEPENDENT_AMBULATORY_CARE_PROVIDER_SITE_OTHER): Payer: Self-pay | Admitting: Otolaryngology

## 2024-04-02 VITALS — BP 131/91 | HR 71

## 2024-04-02 DIAGNOSIS — M270 Developmental disorders of jaws: Secondary | ICD-10-CM

## 2024-04-02 DIAGNOSIS — K1370 Unspecified lesions of oral mucosa: Secondary | ICD-10-CM
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
## 2024-04-07 ENCOUNTER — Encounter: Payer: Self-pay | Admitting: Nurse Practitioner

## 2024-04-07 ENCOUNTER — Other Ambulatory Visit: Payer: Self-pay | Admitting: Nurse Practitioner

## 2024-04-07 DIAGNOSIS — N644 Mastodynia: Secondary | ICD-10-CM

## 2024-04-07 NOTE — Progress Notes (Signed)
 Catherine Garza - 48 y.o. female MRN [id number]  Date of birth: 1976-05-05  Office Visit Note: Visit Date: 03/26/2024 PCP: Theotis Haze ORN, NP Referred by: Theotis Haze ORN, NP  Subjective: Chief Complaint  Patient presents with   Lower Back - Pain   HPI:  Catherine Garza is a 48 y.o. female who comes in today for planned repeat Right L5-S1  Lumbar Interlaminar epidural steroid injection with fluoroscopic guidance.  The patient has failed conservative care including home exercise, medications, time and activity modification.  This injection will be diagnostic and hopefully therapeutic.  Please see requesting physician notes for further details and justification. Patient received more than 50% pain relief from prior injection.  She reports 9 months of good relief with the back pain and hip pain from the injection.  We have diagnosed her with fibromyalgia which she met really all the qualifications for.  She saw Dr. Murray at [facility] physical medicine last year but has not really returned to see them.  She likely would benefit from more comprehensive pain management with this issue of central sensitization pain syndrome.  She did however do really well with the injection and we will repeat that today.   Referring: Duwaine Pouch, FNP   ROS Otherwise per HPI.  Assessment & Plan: Visit Diagnoses:    ICD-10-CM   1. Lumbar radiculopathy  M54.16 XR C-ARM NO REPORT    Epidural Steroid injection    methylPREDNISolone  acetate (DEPO-MEDROL ) injection 40 mg      Plan: No additional findings.   Meds & Orders:  Meds ordered this encounter  Medications   methylPREDNISolone  acetate (DEPO-MEDROL ) injection 40 mg    Orders Placed This Encounter  Procedures   XR C-ARM NO REPORT   Epidural Steroid injection    Follow-up: Return for visit to requesting provider as needed.   Procedures: No procedures performed  Lumbar Epidural Steroid Injection - Interlaminar Approach with  Fluoroscopic Guidance  Patient: Catherine Garza      Date of Birth: 1975/11/08 MRN: [id number] PCP: Theotis Haze ORN, NP      Visit Date: 03/26/2024   Universal Protocol:     Consent Given By: the patient  Position: PRONE  Additional Comments: Vital signs were monitored before and after the procedure. Patient was prepped and draped in the usual sterile fashion. The correct patient, procedure, and site was verified.   Injection Procedure Details:   Procedure diagnoses: Lumbar radiculopathy [M54.16]   Meds Administered:  Meds ordered this encounter  Medications   methylPREDNISolone  acetate (DEPO-MEDROL ) injection 40 mg     Laterality: Midline  Location/Site:  L5-S1  Needle: 4.5 in., 20 ga. Tuohy  Needle Placement: Paramedian epidural  Findings:   -Comments: Excellent flow of contrast into the epidural space.  Procedure Details: Using a paramedian approach from the side mentioned above, the region overlying the inferior lamina was localized under fluoroscopic visualization and the soft tissues overlying this structure were infiltrated with 4 ml. of 1% Lidocaine without Epinephrine. The Tuohy needle was inserted into the epidural space using a paramedian approach.   The epidural space was localized using loss of resistance along with counter oblique bi-planar fluoroscopic views.  After negative aspirate for air, blood, and CSF, a 2 ml. volume of Isovue-250 was injected into the epidural space and the flow of contrast was observed. Radiographs were obtained for documentation purposes.    The injectate was administered into the level noted above.   Additional Comments:  The  patient tolerated the procedure well Dressing: 2 x 2 sterile gauze and Band-Aid    Post-procedure details: Patient was observed during the procedure. Post-procedure instructions were reviewed.  Patient left the clinic in stable condition.   Clinical History: Patient name: NADWE  [facility]  Referring physician: MARYLA HY  Radiologist: Rogelia CHRISTELLA Ley, MD  Date of service: 04/10/2019  Exam requested: MRI SPINE LUMBAR  SPINE W/O CONTRAST   Findings:  T12-L1: No disc herniation, central canal, or foraminal stenosis.  L1-2: No disc herniation, central canal, or foraminal stenosis.  L2-3: No disc herniation, central canal, or foraminal stenosis.  L3-4: No disc herniation, central canal, or foraminal stenosis.  L4-5: No disc herniation, central canal, or foraminal stenosis.  L5-S1: There is a central protrusion-type disc herniation impinging the ventral thecal sac and contacting the traversing S1 nerve roots (axial 23, sagittal 6 ).  No significant disc height loss is present.  On the STIR images there is an annular tear posteriorly (sagittal STIR image 6).  No significant foraminal stenosis.  No osteophyte formation or degenerative changes.   IMPRESSION:  1. 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  Within a reasonable degree of certainty, the disc herniation appears t traumatic in etiology and associated with the date of injury 02/03/2019 with clinical correlation is recommended     Objective:  VS:  HT:    WT:   BMI:     BP:(!) 137/91  HR:91bpm  TEMP: ( )  RESP:  Physical Exam Vitals and nursing note reviewed.  Constitutional:      General: She is not in acute distress.    Appearance: Normal appearance. She is well-developed. She is not ill-appearing.  HENT:     Head: Normocephalic and atraumatic.     Right Ear: External ear normal.     Left Ear: External ear normal.  Eyes:     Extraocular Movements: Extraocular movements intact.     Conjunctiva/sclera: Conjunctivae normal.     Pupils: Pupils are equal, round, and reactive to light.  Cardiovascular:     Rate and Rhythm: Normal rate.     Pulses: Normal pulses.  Pulmonary:     Effort:  Pulmonary effort is normal. No respiratory distress.  Abdominal:     General: There is no distension.     Palpations: Abdomen is soft.  Musculoskeletal:        General: Tenderness present.     Cervical back: Neck supple.     Right lower leg: No edema.     Left lower leg: No edema.     Comments: Patient has good distal strength with no pain over the greater trochanters.  No clonus or focal weakness.  Skin:    General: Skin is warm and dry.     Findings: No erythema, lesion or rash.  Neurological:     General: No focal deficit present.     Mental Status: She is alert and oriented to person, place, and time.     Sensory: No sensory deficit.     Motor: No weakness or abnormal muscle tone.     Coordination: Coordination normal.     Gait: Gait normal.  Psychiatric:        Mood and Affect: Mood normal.        Behavior: Behavior normal.      Imaging: No results found.

## 2024-04-16 ENCOUNTER — Ambulatory Visit: Admitting: Physical Medicine and Rehabilitation

## 2024-04-30 ENCOUNTER — Ambulatory Visit: Admitting: Physical Medicine and Rehabilitation

## 2024-04-30 ENCOUNTER — Encounter: Payer: Self-pay | Admitting: Physical Medicine and Rehabilitation

## 2024-04-30 DIAGNOSIS — G8929 Other chronic pain: Secondary | ICD-10-CM | POA: Diagnosis not present

## 2024-04-30 DIAGNOSIS — M5416 Radiculopathy, lumbar region: Secondary | ICD-10-CM | POA: Diagnosis not present

## 2024-04-30 DIAGNOSIS — M797 Fibromyalgia: Secondary | ICD-10-CM

## 2024-04-30 DIAGNOSIS — M5441 Lumbago with sciatica, right side: Secondary | ICD-10-CM | POA: Diagnosis not present
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
## 2024-04-30 NOTE — Progress Notes (Signed)
 Pain Scale   Average Pain 0 Patient advising she feels better after her injection, she is still experiencing some leg discomfort.        +Driver, -BT, -Dye Allergies.

## 2024-05-14 ENCOUNTER — Other Ambulatory Visit: Payer: Self-pay | Admitting: Physical Medicine & Rehabilitation

## 2024-05-20 ENCOUNTER — Other Ambulatory Visit (HOSPITAL_COMMUNITY): Payer: Self-pay

## 2024-05-20 ENCOUNTER — Encounter (HOSPITAL_COMMUNITY): Payer: Self-pay

## 2024-06-18 ENCOUNTER — Ambulatory Visit (HOSPITAL_COMMUNITY)
Admission: EM | Admit: 2024-06-18 | Discharge: 2024-06-19 | Disposition: A | Discharge status: Other Acute Inpatient Hospital | Attending: Family Medicine | Admitting: Family Medicine

## 2024-06-18 DIAGNOSIS — F23 Brief psychotic disorder: Secondary | ICD-10-CM | POA: Insufficient documentation

## 2024-06-18 DIAGNOSIS — Z6379 Other stressful life events affecting family and household: Secondary | ICD-10-CM | POA: Insufficient documentation

## 2024-06-18 DIAGNOSIS — F4310 Post-traumatic stress disorder, unspecified: Secondary | ICD-10-CM | POA: Insufficient documentation

## 2024-06-18 DIAGNOSIS — M797 Fibromyalgia: Secondary | ICD-10-CM | POA: Insufficient documentation

## 2024-06-18 DIAGNOSIS — I10 Essential (primary) hypertension: Secondary | ICD-10-CM | POA: Insufficient documentation

## 2024-06-18 DIAGNOSIS — F333 Major depressive disorder, recurrent, severe with psychotic symptoms: Secondary | ICD-10-CM | POA: Diagnosis not present

## 2024-06-18 DIAGNOSIS — F419 Anxiety disorder, unspecified: Secondary | ICD-10-CM | POA: Insufficient documentation

## 2024-06-18 DIAGNOSIS — Z635 Disruption of family by separation and divorce: Secondary | ICD-10-CM | POA: Insufficient documentation

## 2024-06-18 LAB — POCT URINE DRUG SCREEN - MANUAL ENTRY (I-SCREEN)
POC Amphetamine UR: NOT DETECTED
POC Buprenorphine (BUP): NOT DETECTED
POC Cocaine UR: NOT DETECTED
POC Marijuana UR: NOT DETECTED
POC Methadone UR: NOT DETECTED
POC Methamphetamine UR: NOT DETECTED
POC Morphine: NOT DETECTED
POC Oxazepam (BZO): NOT DETECTED
POC Oxycodone UR: NOT DETECTED
POC Secobarbital (BAR): NOT DETECTED

## 2024-06-18 LAB — URINALYSIS, ROUTINE W REFLEX MICROSCOPIC
Bilirubin Urine: NEGATIVE
Glucose, UA: NEGATIVE mg/dL
Ketones, ur: NEGATIVE mg/dL
Leukocytes,Ua: NEGATIVE
Nitrite: NEGATIVE
Protein, ur: NEGATIVE mg/dL
Specific Gravity, Urine: 1.01 (ref 1.005–1.030)
pH: 6 (ref 5.0–8.0)

## 2024-06-18 LAB — GLUCOSE, CAPILLARY: Glucose-Capillary: 105 mg/dL — ABNORMAL HIGH (ref 70–99)

## 2024-06-18 MED ORDER — DIPHENHYDRAMINE HCL 50 MG PO CAPS: 50.0000 mg | ORAL_CAPSULE | Freq: Three times a day (TID) | ORAL | Status: DC | PRN

## 2024-06-18 MED ORDER — DIPHENHYDRAMINE HCL 50 MG/ML IJ SOLN: 50.0000 mg | Freq: Three times a day (TID) | INTRAMUSCULAR | Status: DC | PRN

## 2024-06-18 MED ORDER — AMLODIPINE BESYLATE 10 MG PO TABS
10.0000 mg | ORAL_TABLET | Freq: Once | ORAL | Status: AC
  Administered 2024-06-18: 10 mg via ORAL
  Filled 2024-06-18: qty 1

## 2024-06-18 MED ORDER — HALOPERIDOL LACTATE 5 MG/ML IJ SOLN: 10.0000 mg | Freq: Three times a day (TID) | INTRAMUSCULAR | Status: DC | PRN

## 2024-06-18 MED ORDER — ACETAMINOPHEN 325 MG PO TABS: 650.0000 mg | ORAL_TABLET | Freq: Four times a day (QID) | ORAL | Status: DC | PRN

## 2024-06-18 MED ORDER — HYDROXYZINE HCL 25 MG PO TABS
25.0000 mg | ORAL_TABLET | Freq: Three times a day (TID) | ORAL | Status: DC | PRN
  Administered 2024-06-18: 25 mg via ORAL
  Filled 2024-06-18: qty 1

## 2024-06-18 MED ORDER — LORAZEPAM 2 MG/ML IJ SOLN: 2.0000 mg | Freq: Three times a day (TID) | INTRAMUSCULAR | Status: DC | PRN

## 2024-06-18 MED ORDER — PREGABALIN 50 MG PO CAPS
50.0000 mg | ORAL_CAPSULE | Freq: Three times a day (TID) | ORAL | Status: DC
  Administered 2024-06-18 – 2024-06-19 (×3): 50 mg via ORAL
  Filled 2024-06-18 (×3): qty 1

## 2024-06-18 MED ORDER — HALOPERIDOL LACTATE 5 MG/ML IJ SOLN: 5.0000 mg | Freq: Three times a day (TID) | INTRAMUSCULAR | Status: DC | PRN

## 2024-06-18 MED ORDER — HALOPERIDOL 5 MG PO TABS: 5.0000 mg | ORAL_TABLET | Freq: Three times a day (TID) | ORAL | Status: DC | PRN

## 2024-06-18 MED ORDER — OLANZAPINE 5 MG PO TBDP
5.0000 mg | ORAL_TABLET | Freq: Two times a day (BID) | ORAL | Status: DC
Start: 2024-06-18 — End: 2024-06-19
  Administered 2024-06-18 – 2024-06-19 (×2): 5 mg via ORAL
  Filled 2024-06-18 (×2): qty 1

## 2024-06-18 MED ORDER — MAGNESIUM HYDROXIDE 400 MG/5ML PO SUSP: 30.0000 mL | Freq: Every day | ORAL | Status: DC | PRN

## 2024-06-18 MED ORDER — PRAZOSIN HCL 1 MG PO CAPS
1.0000 mg | ORAL_CAPSULE | Freq: Every day | ORAL | Status: DC
  Administered 2024-06-18: 1 mg via ORAL
  Filled 2024-06-18: qty 1

## 2024-06-18 MED ORDER — ALUM & MAG HYDROXIDE-SIMETH 200-200-20 MG/5ML PO SUSP: 30.0000 mL | ORAL | Status: DC | PRN
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
## 2024-06-18 NOTE — Progress Notes (Signed)
 Pt has been accepted to [facility] on 06/18/2024 . Bed assignment: Main campus  Pt meets inpatient criteria per Luke Lesches, NP   Attending Physician will be Millie Manners, MD  Report can be called to: [phone] (this is a pager, please leave call-back number when giving report)  Pt can arrive after 8 AM  Care Team Notified: Roque Eis, RN

## 2024-06-18 NOTE — BH Specialist Note (Signed)
 Unable to complete IVC notary unavailable. If patient remains cooperative, hold off on IVC. Per notes patient has been accepted to [facility] tomorrow.

## 2024-06-18 NOTE — Progress Notes (Signed)
 Called and spoke with pt sister [name] to update with information regarding pt admission to inpatient and to ensure that pts 47 yo child would be cared for while pt is admitted. [name] stated that members daughter was in her care and safe.
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
## 2024-06-18 NOTE — ED Notes (Signed)
 Patient admitted to continuous observation unit due to paranoia and religious fixation. Calm, cooperative throughout interview process. Skin assessment completed. Oriented to unit. Meal and drink offered. Patient alert & oriented x4. Denies intent to harm self or others when asked, although reports she wishes she could afford to have SI thoughts as she can't do that currently. Denies A/VH. Patient denies any physical pain when asked, reports bowel concerns but denies need for intervention at this time. No acute distress noted. Support and encouragement provided. Routine safety checks conducted per facility protocol. Encouraged patient to notify staff if any thoughts of harm towards self or others arise. Patient verbalizes understanding and agreement.
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
## 2024-06-18 NOTE — Progress Notes (Signed)
 Inpatient Psychiatric Referral  Patient was recommended inpatient per Luke Lesches, NP . There are no available beds at [facility], per [facility]. Patient was referred to the following out of network facilities:  Destination  Service Provider Address Phone Fax  [facility]  [address] [place] [address] [phone]  [facility]  [address] [place], [place] [address] [phone]  [facility]  [address] [place] [address] [phone]  [facility]  [address] [place] [address] [phone]  [facility]  [address], [place] [address] [phone]  [facility]  [address], [place] [address] [phone]  [facility] EFAX  [address] [place], [place] [phone]  [facility]  [address], [place] [address] [phone]  [facility]  [address] [place] [address] [phone]    Situation ongoing, CSW to continue following and update chart as more information becomes available.  Harrie Sofia MSW, LCSWA 06/18/2024 7:16 PM

## 2024-06-18 NOTE — ED Provider Notes (Signed)
 Patient is being IVC, petitioner is this Clinical research associate.  Respondent is actively psychotic as she reports that demons are attacking her, something is inside her body eating her up, respondent has not slept for several days to the point she is unable to quantify how many days she has been awake. Spot it is paranoid and thinks that something is wrong with her family and that they are under attack. Respondent has made statements that some force is trying to make her commit suicide. Respondent is actively psychotic, hyperreligious and manic.  Respondent admits that she has been psychiatrically hospitalized at least twice in her lifetime however is unable to provide definitive diagnoses.  Respondent is a danger to herself and others in her current mental state and needs to be stabilized in a psychiatric facility to ensure safety.

## 2024-06-19 NOTE — Discharge Instructions (Addendum)
 Pt transferred to [facility] for inpatient treatment

## 2024-06-19 NOTE — ED Notes (Signed)
 [facility] Paged to give report.
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
## 2024-06-19 NOTE — ED Notes (Signed)
 Patient discharged in no acute distress. She denies SI/HI or AVH. Pt stated that she rested well last night. She dreamed, but could not remember it so she was happy about that. Patient stated I just wish I would've gotten help sooner. Pt aware of discharging to [facility]. Breakfast and beverage provided before pt left. Belongings were returned from locker #31. Patient transported by [employer].

## 2024-06-24 DIAGNOSIS — F339 Major depressive disorder, recurrent, unspecified: Secondary | ICD-10-CM | POA: Diagnosis not present

## 2024-06-24 DIAGNOSIS — R600 Localized edema: Secondary | ICD-10-CM | POA: Diagnosis not present

## 2024-06-25 DIAGNOSIS — G4700 Insomnia, unspecified: Secondary | ICD-10-CM | POA: Diagnosis not present

## 2024-06-25 DIAGNOSIS — F39 Unspecified mood [affective] disorder: Secondary | ICD-10-CM | POA: Diagnosis not present

## 2024-06-25 DIAGNOSIS — R600 Localized edema: Secondary | ICD-10-CM | POA: Diagnosis not present

## 2024-06-25 DIAGNOSIS — F3164 Bipolar disorder, current episode mixed, severe, with psychotic features: Secondary | ICD-10-CM | POA: Diagnosis not present

## 2024-06-25 DIAGNOSIS — M797 Fibromyalgia: Secondary | ICD-10-CM | POA: Diagnosis not present

## 2024-06-25 DIAGNOSIS — F4320 Adjustment disorder, unspecified: Secondary | ICD-10-CM | POA: Diagnosis not present

## 2024-06-25 DIAGNOSIS — I10 Essential (primary) hypertension: Secondary | ICD-10-CM | POA: Diagnosis not present

## 2024-06-25 DIAGNOSIS — F515 Nightmare disorder: Secondary | ICD-10-CM | POA: Diagnosis not present

## 2024-06-25 DIAGNOSIS — F319 Bipolar disorder, unspecified: Secondary | ICD-10-CM | POA: Diagnosis not present

## 2024-06-26 DIAGNOSIS — F3164 Bipolar disorder, current episode mixed, severe, with psychotic features: Secondary | ICD-10-CM | POA: Diagnosis not present

## 2024-06-26 DIAGNOSIS — F515 Nightmare disorder: Secondary | ICD-10-CM | POA: Diagnosis not present

## 2024-06-26 DIAGNOSIS — R600 Localized edema: Secondary | ICD-10-CM | POA: Diagnosis not present

## 2024-06-26 DIAGNOSIS — F39 Unspecified mood [affective] disorder: Secondary | ICD-10-CM | POA: Diagnosis not present

## 2024-06-26 DIAGNOSIS — F4320 Adjustment disorder, unspecified: Secondary | ICD-10-CM | POA: Diagnosis not present

## 2024-06-26 DIAGNOSIS — G4700 Insomnia, unspecified: Secondary | ICD-10-CM | POA: Diagnosis not present

## 2024-06-26 DIAGNOSIS — I10 Essential (primary) hypertension: Secondary | ICD-10-CM | POA: Diagnosis not present

## 2024-06-26 DIAGNOSIS — M797 Fibromyalgia: Secondary | ICD-10-CM | POA: Diagnosis not present

## 2024-06-26 DIAGNOSIS — F319 Bipolar disorder, unspecified: Secondary | ICD-10-CM | POA: Diagnosis not present

## 2024-06-27 DIAGNOSIS — F3164 Bipolar disorder, current episode mixed, severe, with psychotic features: Secondary | ICD-10-CM | POA: Diagnosis not present

## 2024-06-27 DIAGNOSIS — R600 Localized edema: Secondary | ICD-10-CM | POA: Diagnosis not present

## 2024-06-27 DIAGNOSIS — M797 Fibromyalgia: Secondary | ICD-10-CM | POA: Diagnosis not present

## 2024-06-27 DIAGNOSIS — I10 Essential (primary) hypertension: Secondary | ICD-10-CM | POA: Diagnosis not present

## 2024-06-27 DIAGNOSIS — G4700 Insomnia, unspecified: Secondary | ICD-10-CM | POA: Diagnosis not present

## 2024-06-27 DIAGNOSIS — F4320 Adjustment disorder, unspecified: Secondary | ICD-10-CM | POA: Diagnosis not present

## 2024-06-27 DIAGNOSIS — F319 Bipolar disorder, unspecified: Secondary | ICD-10-CM | POA: Diagnosis not present

## 2024-06-27 DIAGNOSIS — F39 Unspecified mood [affective] disorder: Secondary | ICD-10-CM | POA: Diagnosis not present

## 2024-06-27 DIAGNOSIS — F515 Nightmare disorder: Secondary | ICD-10-CM | POA: Diagnosis not present

## 2024-06-30 ENCOUNTER — Encounter: Admitting: Physical Medicine & Rehabilitation

## 2024-07-10 ENCOUNTER — Encounter: Payer: Self-pay | Admitting: Family Medicine

## 2024-07-10 ENCOUNTER — Other Ambulatory Visit (HOSPITAL_COMMUNITY): Payer: Self-pay

## 2024-07-10 ENCOUNTER — Ambulatory Visit: Admitting: Family Medicine

## 2024-07-10 ENCOUNTER — Other Ambulatory Visit: Payer: Self-pay

## 2024-07-10 VITALS — BP 127/86 | HR 75 | Ht 66.5 in | Wt 189.0 lb

## 2024-07-10 DIAGNOSIS — I10 Essential (primary) hypertension: Secondary | ICD-10-CM

## 2024-07-10 DIAGNOSIS — F411 Generalized anxiety disorder: Secondary | ICD-10-CM

## 2024-07-10 DIAGNOSIS — T461X5A Adverse effect of calcium-channel blockers, initial encounter: Secondary | ICD-10-CM

## 2024-07-10 MED ORDER — CHLORTHALIDONE 25 MG PO TABS
25.0000 mg | ORAL_TABLET | Freq: Every day | ORAL | 0 refills | Status: DC
  Filled 2024-07-10 (×2): qty 30, 30d supply, fill #0

## 2024-07-10 NOTE — Progress Notes (Signed)
 Established Patient Office Visit  Subjective    [name], female    DOB: 08-28-1976  Age: 48 y.o. MRN: [id number]  CC:  Chief Complaint  Patient presents with   Hospitalization Follow-up    Pt reports she was put on amlodipine  in hospital but her legs are now swollen and it is making it difficult for her to walk.     HPI [name] presents with complaint of LE swelling with pain and now difficulty walking since starting amlodipine . Patient has been taking only for 3-4 weeks.   Outpatient Encounter Medications as of 07/10/2024  Medication Sig   amLODipine  (NORVASC ) 10 MG tablet Take 10 mg by mouth daily.   chlorthalidone (HYGROTON) 25 MG tablet Take 1 tablet (25 mg total) by mouth daily.   ibuprofen  (ADVIL ) 800 MG tablet Take 1 tablet (800 mg total) by mouth daily as needed for moderate pain (pain score 4-6).   Multiple Vitamins-Minerals (WOMENS MULTI GUMMIES PO) Take by mouth.   prazosin  (MINIPRESS ) 1 MG capsule Take 1 mg by mouth at bedtime.   pregabalin  (LYRICA ) 50 MG capsule Take 1 capsule (50 mg total) by mouth 3 (three) times daily.   QUEtiapine (SEROQUEL) 50 MG tablet Take 50 mg by mouth at bedtime.   losartan  (COZAAR ) 100 MG tablet Take 1 tablet (100 mg total) by mouth daily.   No facility-administered encounter medications on file as of 07/10/2024.    Past Medical History:  Diagnosis Date   Allergy    Anemia    Depression    Fibromyalgia    Hypertension     Past Surgical History:  Procedure Laterality Date   leep      Family History  Problem Relation Age of Onset   Hypertension Mother    Hypertension Father    Ovarian cancer Maternal Grandmother    Throat cancer Paternal Grandfather        smoker    Colon cancer Neg Hx    Rectal cancer Neg Hx    Stomach cancer Neg Hx     Social History   Socioeconomic History   Marital status: Married    Spouse name: Not on file   Number of children: Not on file   Years of  education: Not on file   Highest education level: Not on file  Occupational History   Not on file  Tobacco Use   Smoking status: Never   Smokeless tobacco: Never  Vaping Use   Vaping status: Never Used  Substance and Sexual Activity   Alcohol use: Not Currently    Comment: occ   Drug use: Never   Sexual activity: Yes    Birth control/protection: None  Other Topics Concern   Not on file  Social History Narrative   Not on file   Social Drivers of Health   Financial Resource Strain: Patient Declined (12/26/2023)   Overall Financial Resource Strain (CARDIA)    Difficulty of Paying Living Expenses: Patient declined  Food Insecurity: Patient Declined (12/26/2023)   Hunger Vital Sign    Worried About Running Out of Food in the Last Year: Patient declined    Ran Out of Food in the Last Year: Patient declined  Transportation Needs: Patient Declined (12/26/2023)   PRAPARE - Administrator, Civil Service (Medical): Patient declined    Lack of Transportation (Non-Medical): Patient declined  Physical Activity: Unknown (12/26/2023)   Exercise Vital Sign    Days of Exercise per Week:  Patient declined    Minutes of Exercise per Session: Not on file  Stress: Patient Declined (12/26/2023)   [employer] of Occupational Health - Occupational Stress Questionnaire    Feeling of Stress : Patient declined  Social Connections: Unknown (12/26/2023)   Social Connection and Isolation Panel    Frequency of Communication with Friends and Family: Patient declined    Frequency of Social Gatherings with Friends and Family: Patient declined    Attends Religious Services: Patient declined    Database administrator or Organizations: Patient declined    Attends Banker Meetings: Not on file    Marital Status: Patient declined  Intimate Partner Violence: Unknown (12/30/2021)   Received from [facility]   HITS    Physically Hurt: Not on file    Insult or Talk Down To: Not on file     Threaten Physical Harm: Not on file    Scream or Curse: Not on file    ROS      Objective    BP 127/86   Pulse 75   Ht 5' 6.5 (1.689 m)   Wt 189 lb (85.7 kg)   SpO2 98%   BMI 30.05 kg/m   Physical Exam Vitals and nursing note reviewed.  Constitutional:      General: She is not in acute distress. Cardiovascular:     Rate and Rhythm: Normal rate and regular rhythm.  Pulmonary:     Effort: Pulmonary effort is normal.     Breath sounds: Normal breath sounds.  Abdominal:     Palpations: Abdomen is soft.     Tenderness: There is no abdominal tenderness.  Musculoskeletal:     Right lower leg: Edema present.     Left lower leg: Edema present.  Neurological:     General: No focal deficit present.     Mental Status: She is alert and oriented to person, place, and time.  Psychiatric:        Mood and Affect: Mood is anxious.         Assessment & Plan:  1. Essential hypertension (Primary) Will d/c amlodipine  and change to chlorthalidone daily  2. Adverse effect of amlodipine  As above  3. Anxiety state    Return if symptoms worsen or fail to improve.   Tanda Raguel SQUIBB, MD

## 2024-07-11 ENCOUNTER — Encounter: Attending: Physical Medicine & Rehabilitation | Admitting: Physical Medicine & Rehabilitation

## 2024-07-11 ENCOUNTER — Encounter: Payer: Self-pay | Admitting: Physical Medicine & Rehabilitation

## 2024-07-11 VITALS — BP 120/86 | HR 72 | Ht 66.5 in | Wt 190.0 lb

## 2024-07-11 DIAGNOSIS — M797 Fibromyalgia: Secondary | ICD-10-CM | POA: Insufficient documentation

## 2024-07-11 DIAGNOSIS — G8929 Other chronic pain: Secondary | ICD-10-CM | POA: Diagnosis not present

## 2024-07-11 DIAGNOSIS — F32A Depression, unspecified: Secondary | ICD-10-CM | POA: Insufficient documentation

## 2024-07-11 DIAGNOSIS — M5441 Lumbago with sciatica, right side: Secondary | ICD-10-CM | POA: Insufficient documentation
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
## 2024-07-24 ENCOUNTER — Other Ambulatory Visit: Payer: Self-pay | Admitting: Obstetrics and Gynecology

## 2024-07-24 DIAGNOSIS — Z1231 Encounter for screening mammogram for malignant neoplasm of breast: Secondary | ICD-10-CM

## 2024-07-28 ENCOUNTER — Encounter: Payer: Self-pay | Admitting: Radiology

## 2024-08-02 ENCOUNTER — Other Ambulatory Visit: Payer: Self-pay | Admitting: Physical Medicine & Rehabilitation

## 2024-08-02 ENCOUNTER — Other Ambulatory Visit: Payer: Self-pay | Admitting: Family Medicine

## 2024-08-04 ENCOUNTER — Other Ambulatory Visit (HOSPITAL_COMMUNITY): Payer: Self-pay

## 2024-08-04 ENCOUNTER — Other Ambulatory Visit: Payer: Self-pay

## 2024-08-04 MED ORDER — CHLORTHALIDONE 25 MG PO TABS
25.0000 mg | ORAL_TABLET | Freq: Every day | ORAL | 0 refills | Status: DC
  Filled 2024-08-04: qty 30, 30d supply, fill #0

## 2024-08-04 NOTE — Telephone Encounter (Signed)
 Requested medication (s) are due for refill today - yes #30  Requested medication (s) are on the active medication list -yes  Future visit scheduled -no  Last refill: 07/10/24  Notes to clinic: fails lab protocol- over 1 year- 05/09/23  Requested Prescriptions  Pending Prescriptions Disp Refills   chlorthalidone (HYGROTON) 25 MG tablet 30 tablet 0    Sig: Take 1 tablet (25 mg total) by mouth daily.     Cardiovascular: Diuretics - Thiazide Failed - 08/04/2024  4:20 PM      Failed - Cr in normal range and within 180 days    Creatinine, Ser  Date Value Ref Range Status  05/09/2023 0.79 0.57 - 1.00 mg/dL Final         Failed - K in normal range and within 180 days    Potassium  Date Value Ref Range Status  05/09/2023 4.0 3.5 - 5.2 mmol/L Final         Failed - Na in normal range and within 180 days    Sodium  Date Value Ref Range Status  05/09/2023 138 134 - 144 mmol/L Final         Passed - Last BP in normal range    BP Readings from Last 1 Encounters:  07/11/24 120/86         Passed - Valid encounter within last 6 months    Recent Outpatient Visits           3 weeks ago Essential hypertension   [facility], MD   6 months ago Pruritic condition   [facility] [place]. [facility] Theotis Haze ORN, NP   1 year ago Encounter for annual physical exam   [facility] [place] [facility] [place]. [facility] Theotis Haze ORN, NP   1 year ago Primary hypertension   [facility] [place] [facility] [place]. [facility] Theotis Haze ORN, NP   1 year ago Primary hypertension   [facility] [place] [facility] [place]. [facility] Theotis Haze ORN, NP                 Requested Prescriptions  Pending Prescriptions Disp Refills   chlorthalidone (HYGROTON) 25 MG tablet 30 tablet 0    Sig: Take 1 tablet (25 mg total)  by mouth daily.     Cardiovascular: Diuretics - Thiazide Failed - 08/04/2024  4:20 PM      Failed - Cr in normal range and within 180 days    Creatinine, Ser  Date Value Ref Range Status  05/09/2023 0.79 0.57 - 1.00 mg/dL Final         Failed - K in normal range and within 180 days    Potassium  Date Value Ref Range Status  05/09/2023 4.0 3.5 - 5.2 mmol/L Final         Failed - Na in normal range and within 180 days    Sodium  Date Value Ref Range Status  05/09/2023 138 134 - 144 mmol/L Final         Passed - Last BP in normal range    BP Readings from Last 1 Encounters:  07/11/24 120/86         Passed - Valid encounter within last 6 months    Recent Outpatient Visits  3 weeks ago Essential hypertension   [facility], [place]   6 months ago Pruritic condition   [facility] [place] [facility] [place]. [facility] Theotis Haze ORN, NP   1 year ago Encounter for annual physical exam   [facility] [place] [facility] [place]. [facility] Theotis Haze ORN, NP   1 year ago Primary hypertension   [facility] [place] [facility] [place] [facility] Of [place]. [facility] Theotis Haze ORN, NP   1 year ago Primary hypertension   [place] [facility] [place] [facility] Of [place]. [facility] Theotis Haze ORN, [place]

## 2024-08-05 ENCOUNTER — Other Ambulatory Visit: Payer: Self-pay

## 2024-08-05 MED ORDER — PREGABALIN 50 MG PO CAPS
50.0000 mg | ORAL_CAPSULE | Freq: Three times a day (TID) | ORAL | 5 refills | Status: AC
  Filled 2024-08-05: qty 90, 30d supply, fill #0
  Filled 2024-09-10: qty 90, 30d supply, fill #1
  Filled 2024-10-24: qty 90, 30d supply, fill #2

## 2024-08-05 MED ORDER — IBUPROFEN 800 MG PO TABS
800.0000 mg | ORAL_TABLET | Freq: Every day | ORAL | 0 refills | Status: DC | PRN
  Filled 2024-08-05: qty 30, 30d supply, fill #0

## 2024-08-06 ENCOUNTER — Other Ambulatory Visit (HOSPITAL_BASED_OUTPATIENT_CLINIC_OR_DEPARTMENT_OTHER): Payer: Self-pay

## 2024-08-06 ENCOUNTER — Other Ambulatory Visit: Payer: Self-pay

## 2024-08-07 ENCOUNTER — Other Ambulatory Visit (HOSPITAL_COMMUNITY): Payer: Self-pay

## 2024-09-04 ENCOUNTER — Encounter: Attending: Physical Medicine & Rehabilitation | Admitting: Physical Medicine & Rehabilitation

## 2024-09-04 ENCOUNTER — Encounter: Payer: Self-pay | Admitting: Physical Medicine & Rehabilitation

## 2024-09-04 VITALS — BP 132/88 | HR 77 | Ht 66.5 in | Wt 190.6 lb

## 2024-09-04 DIAGNOSIS — F32A Depression, unspecified: Secondary | ICD-10-CM | POA: Diagnosis not present

## 2024-09-04 DIAGNOSIS — M797 Fibromyalgia: Secondary | ICD-10-CM | POA: Insufficient documentation
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
## 2024-09-10 ENCOUNTER — Other Ambulatory Visit: Payer: Self-pay | Admitting: Family Medicine

## 2024-09-10 ENCOUNTER — Other Ambulatory Visit: Payer: Self-pay | Admitting: Physical Medicine & Rehabilitation

## 2024-09-10 ENCOUNTER — Ambulatory Visit

## 2024-09-11 ENCOUNTER — Other Ambulatory Visit (HOSPITAL_COMMUNITY): Payer: Self-pay

## 2024-09-11 ENCOUNTER — Other Ambulatory Visit: Payer: Self-pay

## 2024-09-17 ENCOUNTER — Other Ambulatory Visit (HOSPITAL_COMMUNITY): Payer: Self-pay

## 2024-09-17 MED ORDER — IBUPROFEN 800 MG PO TABS
800.0000 mg | ORAL_TABLET | Freq: Every day | ORAL | 0 refills | Status: AC | PRN
  Filled 2024-09-17: qty 30, 30d supply, fill #0

## 2024-09-19 ENCOUNTER — Other Ambulatory Visit: Payer: Self-pay

## 2024-09-23 ENCOUNTER — Other Ambulatory Visit: Payer: Self-pay

## 2024-09-23 ENCOUNTER — Other Ambulatory Visit: Payer: Self-pay | Admitting: Nurse Practitioner

## 2024-09-23 ENCOUNTER — Other Ambulatory Visit (HOSPITAL_COMMUNITY): Payer: Self-pay

## 2024-09-23 MED ORDER — CHLORTHALIDONE 25 MG PO TABS
25.0000 mg | ORAL_TABLET | Freq: Every day | ORAL | 1 refills | Status: AC
  Filled 2024-09-23: qty 90, 90d supply, fill #0

## 2024-09-24 ENCOUNTER — Other Ambulatory Visit (HOSPITAL_COMMUNITY): Payer: Self-pay

## 2024-10-08 ENCOUNTER — Ambulatory Visit
Admission: RE | Admit: 2024-10-08 | Discharge: 2024-10-08 | Disposition: A | Discharge status: Home / Self Care | Payer: MEDICAID | Source: Ambulatory Visit | Attending: Obstetrics and Gynecology | Admitting: Obstetrics and Gynecology

## 2024-10-08 DIAGNOSIS — Z1231 Encounter for screening mammogram for malignant neoplasm of breast: Secondary | ICD-10-CM

## 2024-10-25 ENCOUNTER — Other Ambulatory Visit (HOSPITAL_COMMUNITY): Payer: Self-pay

## 2024-11-06 ENCOUNTER — Encounter: Admitting: Physical Medicine & Rehabilitation
